# Patient Record
Sex: Male | Born: 1955 | Race: White | Hispanic: No | Marital: Single | Smoking: Never smoker
Health system: Southern US, Community
[De-identification: ages and names within clinical notes are randomized; demographics above are authoritative.]

## PROBLEM LIST (undated history)

## (undated) DIAGNOSIS — I10 Essential (primary) hypertension: Secondary | ICD-10-CM

## (undated) DIAGNOSIS — R519 Headache, unspecified: Secondary | ICD-10-CM

## (undated) DIAGNOSIS — M069 Rheumatoid arthritis, unspecified: Secondary | ICD-10-CM

## (undated) DIAGNOSIS — R51 Headache: Secondary | ICD-10-CM

## (undated) DIAGNOSIS — K219 Gastro-esophageal reflux disease without esophagitis: Secondary | ICD-10-CM

## (undated) HISTORY — PX: CARPAL TUNNEL RELEASE: SHX101

## (undated) HISTORY — PX: KNEE SURGERY: SHX244

## (undated) HISTORY — PX: ANKLE SURGERY: SHX546

## (undated) HISTORY — DX: Rheumatoid arthritis, unspecified: M06.9

## (undated) HISTORY — DX: Gastro-esophageal reflux disease without esophagitis: K21.9

## (undated) HISTORY — PX: CYST EXCISION: SHX5701

---

## 2000-01-01 ENCOUNTER — Encounter: Admission: RE | Admit: 2000-01-01 | Discharge: 2000-03-31 | Payer: Self-pay | Admitting: Neurology

## 2001-03-06 ENCOUNTER — Ambulatory Visit (HOSPITAL_BASED_OUTPATIENT_CLINIC_OR_DEPARTMENT_OTHER): Admission: RE | Admit: 2001-03-06 | Discharge: 2001-03-06 | Payer: Self-pay | Admitting: *Deleted

## 2001-09-18 ENCOUNTER — Encounter (HOSPITAL_COMMUNITY): Admission: RE | Admit: 2001-09-18 | Discharge: 2001-10-18 | Payer: Self-pay | Admitting: Preventative Medicine

## 2004-11-27 ENCOUNTER — Ambulatory Visit (HOSPITAL_COMMUNITY): Admission: RE | Admit: 2004-11-27 | Discharge: 2004-11-27 | Payer: Self-pay | Admitting: Family Medicine

## 2005-06-03 ENCOUNTER — Ambulatory Visit (HOSPITAL_COMMUNITY): Admission: RE | Admit: 2005-06-03 | Discharge: 2005-06-03 | Payer: Self-pay | Admitting: Family Medicine

## 2006-01-10 ENCOUNTER — Ambulatory Visit (HOSPITAL_COMMUNITY): Admission: RE | Admit: 2006-01-10 | Discharge: 2006-01-10 | Payer: Self-pay | Admitting: Urology

## 2006-01-20 ENCOUNTER — Ambulatory Visit (HOSPITAL_COMMUNITY): Admission: RE | Admit: 2006-01-20 | Discharge: 2006-01-20 | Payer: Self-pay | Admitting: Family Medicine

## 2006-01-26 ENCOUNTER — Emergency Department (HOSPITAL_COMMUNITY): Admission: EM | Admit: 2006-01-26 | Discharge: 2006-01-26 | Payer: Self-pay | Admitting: Emergency Medicine

## 2006-02-04 ENCOUNTER — Encounter (HOSPITAL_COMMUNITY): Admission: RE | Admit: 2006-02-04 | Discharge: 2006-03-06 | Payer: Self-pay | Admitting: Preventative Medicine

## 2006-12-23 HISTORY — PX: CHOLECYSTECTOMY: SHX55

## 2007-02-06 ENCOUNTER — Encounter (INDEPENDENT_AMBULATORY_CARE_PROVIDER_SITE_OTHER): Payer: Self-pay | Admitting: *Deleted

## 2007-02-06 ENCOUNTER — Ambulatory Visit (HOSPITAL_COMMUNITY): Admission: RE | Admit: 2007-02-06 | Discharge: 2007-02-06 | Payer: Self-pay | Admitting: General Surgery

## 2007-10-23 ENCOUNTER — Ambulatory Visit (HOSPITAL_COMMUNITY): Admission: RE | Admit: 2007-10-23 | Discharge: 2007-10-23 | Payer: Self-pay | Admitting: Family Medicine

## 2007-11-13 ENCOUNTER — Ambulatory Visit (HOSPITAL_COMMUNITY): Admission: RE | Admit: 2007-11-13 | Discharge: 2007-11-13 | Payer: Self-pay | Admitting: Family Medicine

## 2007-12-08 ENCOUNTER — Ambulatory Visit (HOSPITAL_COMMUNITY): Admission: RE | Admit: 2007-12-08 | Discharge: 2007-12-08 | Payer: Self-pay | Admitting: General Surgery

## 2009-02-03 ENCOUNTER — Ambulatory Visit (HOSPITAL_COMMUNITY): Admission: RE | Admit: 2009-02-03 | Discharge: 2009-02-03 | Payer: Self-pay | Admitting: Family Medicine

## 2009-02-10 ENCOUNTER — Ambulatory Visit (HOSPITAL_COMMUNITY): Admission: RE | Admit: 2009-02-10 | Discharge: 2009-02-10 | Payer: Self-pay | Admitting: Family Medicine

## 2009-12-14 ENCOUNTER — Ambulatory Visit (HOSPITAL_COMMUNITY): Admission: RE | Admit: 2009-12-14 | Discharge: 2009-12-14 | Payer: Self-pay | Admitting: Internal Medicine

## 2010-09-18 ENCOUNTER — Encounter (HOSPITAL_COMMUNITY): Admission: RE | Admit: 2010-09-18 | Discharge: 2010-09-21 | Payer: Self-pay | Admitting: Psychiatry

## 2010-10-03 ENCOUNTER — Encounter (HOSPITAL_COMMUNITY)
Admission: RE | Admit: 2010-10-03 | Discharge: 2010-11-02 | Payer: Self-pay | Source: Home / Self Care | Admitting: Psychiatry

## 2011-01-13 ENCOUNTER — Encounter: Payer: Self-pay | Admitting: Urology

## 2011-01-13 ENCOUNTER — Encounter: Payer: Self-pay | Admitting: Internal Medicine

## 2011-05-07 NOTE — H&P (Signed)
NAME:  Gary Cole, Gary Cole NO.:  [id number]   MEDICAL RECORD NO.:  [id number]          PATIENT TYPE:  AMB   LOCATION:  DAY                           FACILITY:  [facility]   PHYSICIAN:  Dalia Heading, M.D.  DATE OF BIRTH:  [birth year]   DATE OF ADMISSION:  DATE OF DISCHARGE:  LH                              HISTORY & PHYSICAL   PRIORITY PREADMISSION HISTORY AND PHYSICAL   CHIEF COMPLAINT:  Need for screening colonoscopy.   HISTORY OF PRESENT ILLNESS:  The patient is a 55 year old, white male,  who is referred for endoscopic evaluation.  He needs a colonoscopy for  screening purposes.  No abdominal pain, weight loss, nausea, vomiting,  diarrhea, constipation, melena, or hematochezia have been noted.  He has  never had a colonoscopy.  There is no family history of colon carcinoma.   PAST MEDICAL HISTORY:  Migraine headaches.   PAST SURGICAL HISTORY:  1. Laparoscopic cholecystectomy.  2. Left foot surgery.  3. Left hand surgery.  4. Neck surgery.   CURRENT MEDICATIONS:  Imitrex.   ALLERGIES:  No known drug allergies.   REVIEW OF SYSTEMS:  Noncontributory.   PHYSICAL EXAMINATION:  GENERAL:  The patient is a well-developed, well-  nourished, white male in no acute distress.  LUNGS:  Clear to auscultation with equal breath sounds bilaterally.  HEART:  A regular rate and rhythm without S3, S4, or murmurs.  ABDOMEN:  Soft, nontender, and nondistended, no hepatosplenomegaly or  masses are noted.  RECTAL EXAMINATION:  Deferred to the procedure.   IMPRESSION:  Need for screening colonoscopy.   PLAN:  The patient is scheduled for a colonoscopy on December 04, 2007.  The risks and benefits of the procedure including bleeding and  perforation were fully explained to the patient, who gave informed  consent.      Dalia Heading, M.D.  Electronically Signed     MAJ/MEDQ  D:  11/28/2007  T:  11/28/2007  Job:  [id number]   cc:   Short Stay at [facility]   Corrie Mckusick, M.D.  Fax: [phone]

## 2011-05-10 NOTE — Op Note (Signed)
NAME:  Gary Cole, Gary Cole NO.:  [id number]   MEDICAL RECORD NO.:  [id number]          PATIENT TYPE:  AMB   LOCATION:  DAY                           FACILITY:  [facility]   PHYSICIAN:  Dalia Heading, M.D.  DATE OF BIRTH:  [birth year]   DATE OF PROCEDURE:  02/06/2007  DATE OF DISCHARGE:                               OPERATIVE REPORT   PREOPERATIVE DIAGNOSIS:  Cholecystitis, cholelithiasis.   POSTOPERATIVE DIAGNOSIS:  Cholecystitis, cholelithiasis.   PROCEDURE:  Laparoscopic cholecystectomy.   SURGEON:  Dalia Heading, M.D.   ANESTHESIA:  General endotracheal.   INDICATIONS:  The patient is a 55 year old white male who presents with  cholecystitis secondary to cholelithiasis.  Risks and benefits of the  procedure including bleeding, infection, hepatobiliary injury, and the  possibly of an open procedure were fully explained to the patient, who  gave informed consent.   PROCEDURE NOTE:  The patient is placed in the supine position.  After  induction of general endotracheal anesthesia, the abdomen was prepped  and draped using the usual sterile technique with Betadine.  Surgical  site confirmation was performed.   An infraumbilical incision was made down to the fascia.  Veress needle  was introduced into the abdominal cavity and confirmation of placement  was done using the saline drop test.  The abdomen was then insufflated  to 16 mmHg pressure.  An 11 mm trocar was introduced into the abdominal  cavity under direct visualization without difficulty.  The patient was  placed in reversed Trendelenburg position.  An additional 11-mm trocar  was in the placed epigastric region and 5-mm trocars were placed in the  right upper quadrant and right flank regions.  The liver was inspected  and noted to within normal limits.  Gallbladder was retracted superior  and laterally.  The dissection was begun around the infundibulum of the  gallbladder.  The cystic duct was first  identified.  Its juncture to the  infundibulum fully identified.  Endoclips placed proximally and distally  on cystic duct and cystic duct was divided.  This was likewise done to  the cystic artery.  The gallbladder then freed away from the gallbladder  fossa using Bovie electrocautery.  The gallbladder delivered through the  epigastric trocar site using the EndoCatch bag.  The gallbladder fossa  was inspected.  No abnormal bleeding or bile leakage was noted.  Surgicel was placed in the gallbladder fossa.  All fluid and air was  then evacuated from the abdominal cavity prior to removing the trocars.   All wounds were irrigated with normal saline.  All wounds were injected  with  0.5% Sensorcaine.  The infraumbilical fascia was reapproximated  using an 0 Vicryl interrupted suture.  All skin incisions were closed  using staples.  Betadine ointment and dry sterile dressings were  applied.   All tape and needle counts correct at the end of the procedure.  The  patient was extubated in the operating room and went back to recovery  room awake in stable condition.  Complications none.   SPECIMEN:  Gallbladder with stones.   ESTIMATED  BLOOD LOSS:  Minimal.  my office to the chart to Dr. Jonny Ruiz holding thank you      Dalia Heading, M.D.  Electronically Signed     MAJ/MEDQ  D:  02/06/2007  T:  02/06/2007  Job:  [id number]   cc:   Corrie Mckusick, M.D.  Fax: [phone]

## 2011-05-10 NOTE — H&P (Signed)
NAME:  Gary Cole, Gary Cole NO.:  [id number]   MEDICAL RECORD NO.:  [id number]          PATIENT TYPE:  AMB   LOCATION:  DAY                           FACILITY:  [facility]   PHYSICIAN:  Dalia Heading, M.D.  DATE OF BIRTH:  14-Nov-1956   DATE OF ADMISSION:  DATE OF DISCHARGE:  LH                              HISTORY & PHYSICAL   CHIEF COMPLAINT:  Cholecystitis, cholelithiasis.   HISTORY OF PRESENT ILLNESS:  The patient is a 55 year old white male who  is referred for evaluation and treatment of biliary colic secondary to  cholelithiasis.  He has been having right upper quadrant abdominal pain,  nausea, bloating for many weeks. He does have fatty food intolerance.  No fever, chills or jaundice have been noted.   PAST MEDICAL HISTORY:  Includes migraines.   PAST SURGICAL HISTORY:  Left foot surgery, left hand surgery, neck  surgery.   CURRENT MEDICATIONS:  Imitrex p.r.n.   ALLERGIES:  NO KNOWN DRUG ALLERGIES.   REVIEW OF SYSTEMS:  Noncontributory.   SOCIAL HISTORY:  The patient drinks alcohol socially.   PHYSICAL EXAMINATION:  GENERAL APPEARANCE:  The patient is a well-  developed, well-nourished white male in no acute distress.  HEENT:  Examination reveals no scleral icterus.  LUNGS:  Clear to auscultation with equal breath sounds bilaterally.  HEART:  Examination reveals regular rate and rhythm without S3, S4, or  murmurs.  ABDOMEN:  The abdomen is soft and nondistended.  He is tender in the  right upper quadrant to palpation.  No hepatosplenomegaly, masses or  hernias are identified.  Ultrasound  of the gallbladder reveals  cholelithiasis with a normal common bile duct.   IMPRESSION:  Cholecystitis, cholelithiasis.   PLAN:  The patient is scheduled for laparoscopic cholecystectomy on  February 09, 2007.  The risks and benefits of the procedure including  bleeding, infection, hepatobiliary injury and the possibility an open  procedure were fully explained to  the patient, who gave informed  consent.      Dalia Heading, M.D.  Electronically Signed     MAJ/MEDQ  D:  01/22/2007  T:  01/22/2007  Job:  [id number]   cc:   [facility]  Fax: [phone]   Corrie Mckusick, M.D.  Fax: [phone]

## 2011-05-10 NOTE — Op Note (Signed)
[place]  Patient:    Gary Cole, Gary Cole                    MRN: [id number]. Date: 03/06/01 Attending:  Lowell Bouton, M.D.                           Operative Report  PREOPERATIVE DIAGNOSIS:  Left carpal tunnel syndrome.  POSTOPERATIVE DIAGNOSIS:  Left carpal tunnel syndrome.  PROCEDURE:  Decompression, median nerve, left carpal tunnel.  SURGEON:  Lowell Bouton, M.D.  ANESTHESIA: 0.5% Marcaine local with sedation.  OPERATIVE FINDINGS:  The patient had moderate compression of the nerve beneath the ligament.  The motor branch was intact, and there were no masses present in the carpal canal.  DESCRIPTION OF PROCEDURE:  Under 0.5% Marcaine local anesthesia, the left hand was prepped and draped in the usual fashion and after exsanguinating the limb, the tourniquet was inflated to 250 mmHg.  A 3 cm longitudinal incision was made in the palm just ulnar to the thenar crease.  Sharp dissection carried through the subcutaneous tissues, and bleeding points were coagulated.  Blunt dissection was carried through the superficial palmar fascia distal to the transverse carpal ligament, and a hemostat was placed in the carpal canal up against the hook of the hamate.  The transverse carpal ligament was then divided with a knife on the ulnar side of the median nerve.  The proximal end of the ligament was divided with the scissors after dissecting the nerve away from the undersurface of the ligament.  The carpal canal was then palpated and was found to be adequately decompressed.  The wound was irrigated copiously with saline.  The skin was closed with 4-0 nylon suture.  Sterile dressings were applied, followed by a volar wrist splint.  The patient tolerated the procedure well and went to the recovery room awake and stable, in good condition. DD:  03/06/01 TD:  03/07/01 Job: [id number]

## 2014-08-23 ENCOUNTER — Ambulatory Visit (INDEPENDENT_AMBULATORY_CARE_PROVIDER_SITE_OTHER): Payer: PRIVATE HEALTH INSURANCE

## 2014-08-23 ENCOUNTER — Ambulatory Visit (INDEPENDENT_AMBULATORY_CARE_PROVIDER_SITE_OTHER): Payer: PRIVATE HEALTH INSURANCE | Admitting: Orthopedic Surgery

## 2014-08-23 ENCOUNTER — Encounter: Payer: Self-pay | Admitting: Orthopedic Surgery

## 2014-08-23 VITALS — BP 138/79 | Ht 71.0 in | Wt 173.4 lb

## 2014-08-23 DIAGNOSIS — M25561 Pain in right knee: Secondary | ICD-10-CM

## 2014-08-23 DIAGNOSIS — M25569 Pain in unspecified knee: Secondary | ICD-10-CM

## 2014-08-23 NOTE — Progress Notes (Signed)
Subjective:     Patient ID: Gary Cole, male   DOB: 09-Sep-1956, 58 y.o.   MRN: [id number]  HPI Chief Complaint  Patient presents with  . Knee Pain    right knee pain 2-3 months, no known injury    Referral by Dr. Phillips Odor  58 year old male who presents with a 2 to three-month history of pain and stiffness and giving way symptoms of his right knee with posterior lateral knee pain which is best described by him as sharp throbbing stabbing aching pain which radiates into the back of his knee. The pain has become constant wakes him up at night worse after activity and he goes up and down inclines plain. He did take Aleve for 2-3 months without relief he put some ice on the knee as well he tried to stay off of it and modify his activities no other treatments were tried at this point.  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  His review of systems shows positive findings under difficulty sleeping sinus problems joint pain leg difficulties as stated headaches and wears glasses all other systems were reviewed and were negative  Review of Systems     Objective:   Physical Exam     BP 138/79  Ht 5\' 11"  (1.803 m)  Wt 173 lb 6.4 oz (78.654 kg)  BMI 24.20 kg/m2 General appearance is normal he has an excellent body weight and BMI is oriented x3 his mood and affect are normal he ambulates without assistive devices  He has normal upper extremities with normal alignment, no joint restriction or contracture or deformity, no instability or subluxation. Normal muscle tone normal skin  pulse temperature, no edema. Normal lymph nodes. Normal sensation. Normal reflexes and normal coordination of limbs  His left knee reveals normal alignment without swelling no tenderness full range of motion no instability normal strength normal muscle tone normal skin pulse temperature no edema swelling varicose veins lymphadenopathy. Normal sensation normal reflexes normal coordination of the limb  Right leg and knee posteromedial joint line tenderness positive McMurray sign no effusion. Full range of motion pain at terminal flexion no instability normal muscle tone and strength normal skin pulse temperature no edema no swelling or varicose veins normal lymph nodes normal sensation normal reflexes normal balance and coordination  Assessment:      X-ray was taken in the office it was normal  Impression torn medial meniscus  Recommend MRI to confirm the diagnosis and planned surgical intervention which is arthroscopic partial medial meniscectomy.     Plan:     MRI KNEE RIGHT

## 2014-08-23 NOTE — Patient Instructions (Signed)
We will schedule MRI for you 

## 2014-09-08 ENCOUNTER — Ambulatory Visit (HOSPITAL_COMMUNITY)
Admission: RE | Admit: 2014-09-08 | Discharge: 2014-09-08 | Disposition: A | Discharge status: Home / Self Care | Payer: PRIVATE HEALTH INSURANCE | Source: Ambulatory Visit | Attending: Orthopedic Surgery | Admitting: Orthopedic Surgery

## 2014-09-08 DIAGNOSIS — R937 Abnormal findings on diagnostic imaging of other parts of musculoskeletal system: Secondary | ICD-10-CM | POA: Diagnosis not present

## 2014-09-08 DIAGNOSIS — M25569 Pain in unspecified knee: Secondary | ICD-10-CM | POA: Diagnosis present

## 2014-09-08 DIAGNOSIS — M659 Unspecified synovitis and tenosynovitis, unspecified site: Secondary | ICD-10-CM | POA: Insufficient documentation

## 2014-09-08 DIAGNOSIS — M7120 Synovial cyst of popliteal space [Baker], unspecified knee: Secondary | ICD-10-CM | POA: Diagnosis not present

## 2014-09-08 DIAGNOSIS — M25561 Pain in right knee: Secondary | ICD-10-CM

## 2014-09-13 ENCOUNTER — Encounter: Payer: Self-pay | Admitting: Orthopedic Surgery

## 2014-09-13 ENCOUNTER — Ambulatory Visit (INDEPENDENT_AMBULATORY_CARE_PROVIDER_SITE_OTHER): Payer: PRIVATE HEALTH INSURANCE | Admitting: Orthopedic Surgery

## 2014-09-13 VITALS — BP 134/88 | Ht 71.0 in | Wt 173.4 lb

## 2014-09-13 DIAGNOSIS — IMO0002 Reserved for concepts with insufficient information to code with codable children: Secondary | ICD-10-CM

## 2014-09-13 DIAGNOSIS — M5410 Radiculopathy, site unspecified: Secondary | ICD-10-CM

## 2014-09-13 DIAGNOSIS — M25569 Pain in unspecified knee: Secondary | ICD-10-CM

## 2014-09-13 DIAGNOSIS — M25561 Pain in right knee: Secondary | ICD-10-CM

## 2014-09-13 MED ORDER — PREDNISONE (PAK) 10 MG PO TABS: ORAL_TABLET | ORAL | Status: DC

## 2014-09-13 MED ORDER — GABAPENTIN 100 MG PO CAPS: 100.0000 mg | ORAL_CAPSULE | Freq: Three times a day (TID) | ORAL | Status: DC

## 2014-09-13 NOTE — Progress Notes (Signed)
Followup visit including MRI evaluation the MRI shows that there is a possible tear of his lateral meniscus  On reexamination and repeat questioning the patient's pain runs from the middle of his thigh proximally to the middle of the shin distally with posterior lateral pain and primarily pain in the calf. He does have a history of lumbar disc disease in his MRI in 2000 and showed quite extensive disease although most of the herniation and bulging was on the left  His giving way symptoms could very well be related to lumbar disc disease he is lateral joint line is nontender he does not have a McMurray sign today and he has no effusion  At this point I cannot reliably give him a good estimate that his knee surgery would help  I recommend a Dosepak and gabapentin and see if he gets any better  His review of symptoms revealed no bowel or bladder dysfunction at this time  Meds ordered this encounter  Medications  . gabapentin (NEURONTIN) 100 MG capsule    Sig: Take 1 capsule (100 mg total) by mouth 3 (three) times daily.    Dispense:  90 capsule    Refill:  2  . predniSONE (STERAPRED UNI-PAK) 10 MG tablet    Sig: Use as directed    Dispense:  48 tablet    Refill:  0

## 2014-10-06 ENCOUNTER — Encounter: Payer: Self-pay | Admitting: Orthopedic Surgery

## 2014-10-06 ENCOUNTER — Ambulatory Visit (INDEPENDENT_AMBULATORY_CARE_PROVIDER_SITE_OTHER): Payer: PRIVATE HEALTH INSURANCE | Admitting: Orthopedic Surgery

## 2014-10-06 VITALS — BP 122/78 | Ht 71.0 in | Wt 173.4 lb

## 2014-10-06 DIAGNOSIS — M25561 Pain in right knee: Secondary | ICD-10-CM

## 2014-10-06 DIAGNOSIS — M5410 Radiculopathy, site unspecified: Secondary | ICD-10-CM

## 2014-10-06 NOTE — Progress Notes (Signed)
Chief Complaint  Patient presents with  . Follow-up    recheck Right knee s/p medication    The patient has done well with steroids and gabapentin  He had an MRI which showed a possible lateral meniscal tear  Is his leg feels much better. He still has some difficulty with steps and with squatting which I suspect is placed on his age and the minor arthritis we saw in his knee.  I don't think is meniscal tear symptomatic I think he has some neurologic pain in his right leg   recommend continue gabapentin 100 mg 3 times a day followup 2 months

## 2014-10-06 NOTE — Patient Instructions (Signed)
Continue gabapentin Refill at pharmacy

## 2014-12-06 ENCOUNTER — Ambulatory Visit: Payer: PRIVATE HEALTH INSURANCE | Admitting: Orthopedic Surgery

## 2015-01-25 ENCOUNTER — Encounter: Payer: Self-pay | Admitting: Orthopedic Surgery

## 2015-09-21 ENCOUNTER — Ambulatory Visit (HOSPITAL_COMMUNITY): Payer: PRIVATE HEALTH INSURANCE | Attending: Orthopedic Surgery

## 2015-09-21 ENCOUNTER — Encounter (HOSPITAL_COMMUNITY): Payer: Self-pay

## 2015-09-21 DIAGNOSIS — M7502 Adhesive capsulitis of left shoulder: Secondary | ICD-10-CM | POA: Insufficient documentation

## 2015-09-21 DIAGNOSIS — M25612 Stiffness of left shoulder, not elsewhere classified: Secondary | ICD-10-CM | POA: Diagnosis present

## 2015-09-21 DIAGNOSIS — M25512 Pain in left shoulder: Secondary | ICD-10-CM | POA: Diagnosis present

## 2015-09-21 DIAGNOSIS — R29898 Other symptoms and signs involving the musculoskeletal system: Secondary | ICD-10-CM | POA: Diagnosis present

## 2015-09-21 DIAGNOSIS — M7552 Bursitis of left shoulder: Secondary | ICD-10-CM | POA: Insufficient documentation

## 2015-09-21 DIAGNOSIS — M629 Disorder of muscle, unspecified: Secondary | ICD-10-CM | POA: Diagnosis present

## 2015-09-21 DIAGNOSIS — M6289 Other specified disorders of muscle: Secondary | ICD-10-CM

## 2015-09-21 NOTE — Therapy (Signed)
[facility] [address] [place], [place], [address] Phone: [phone]   Fax:  [phone]  Occupational Therapy Evaluation  Patient Details  Name: Gary Cole MRN: [id number] Date of Birth: 1956-08-14 Referring Provider:  Sheral Apley, MD  Encounter Date: 09/21/2015      OT End of Session - 09/21/15 1705    Visit Number 1   Number of Visits 8   Date for OT Re-Evaluation 11/20/15  Mini reassess: 10/19/15   Authorization Type medical mutual PPO ([place]) No visit limit   OT Start Time 1531   OT Stop Time 1610   OT Time Calculation (min) 39 min   Activity Tolerance Patient tolerated treatment well   Behavior During Therapy [facility] for tasks assessed/performed      History reviewed. No pertinent past medical history.  No past surgical history on file.  There were no vitals filed for this visit.  Visit Diagnosis:  Bursitis of shoulder region, left - Plan: Ot plan of care cert/re-cert  Adhesive capsulitis of shoulder, left - Plan: Ot plan of care cert/re-cert  Pain in joint, shoulder region, left - Plan: Ot plan of care cert/re-cert  Tight fascia - Plan: Ot plan of care cert/re-cert  Shoulder weakness - Plan: Ot plan of care cert/re-cert  Shoulder stiffness, left - Plan: Ot plan of care cert/re-cert      Subjective Assessment - 09/21/15 1659    Subjective  S: I used to not be able to lift this arm at all.    Pertinent History Patient is a 59 y/o male S/P left shoulder bursitis and adhesive capsulitis which began approximately August 2016 when patient was using weedeater. Pt states that an X-Ray and MRI have been completed and there are no tears noted. Pt states that he has bursitis and something going on with his bicep tendon. Dr. Eulah Pont has referred patient to occupational therapy for evaluation and treatment.    Special Tests FOTO score: 46/100 (54% impaired)   Patient Stated Goals To be able to use arm normally.    Currently in Pain? --  only with movement 7-10/10 pain           [facility] OT Assessment - 09/21/15 1524    Assessment   Diagnosis Left shoulder bursitis and adhesive capsulitis   Onset Date --  August 2016   Assessment November - Dr. Eulah Pont   Prior Therapy None   Precautions   Precautions None   Restrictions   Weight Bearing Restrictions No   Balance Screen   Has the patient fallen in the past 6 months No   Home  Environment   Family/patient expects to be discharged to: Private residence   Prior Function   Level of Independence Independent   Vocation Full time employment   Vocation Requirements [employer] - Lifting   ADL   ADL comments Difficulty reaching overhead using left arm at home and work.    Mobility   Mobility Status Independent   Written Expression   Dominant Hand Right   Vision - History   Baseline Vision Wears glasses all the time   Cognition   Overall Cognitive Status Within Functional Limits for tasks assessed   ROM / Strength   AROM / PROM / Strength AROM;Strength;PROM   Palpation   Palpation comment Mod fascial restictions in left upper arm, trapezius, and scapularis region.    AROM   Overall AROM Comments Assessed seated. ir/ER adducted.   AROM Assessment Site  Shoulder   Right/Left Shoulder Left   Left Shoulder Flexion 91 Degrees   Left Shoulder ABduction 68 Degrees   Left Shoulder Internal Rotation 90 Degrees   Left Shoulder External Rotation 25 Degrees  painful   PROM   Overall PROM  Within functional limits for tasks performed   Overall PROM Comments Assessed supine. ir/ED adducted   PROM Assessment Site Shoulder   Right/Left Shoulder Left   Strength   Overall Strength Unable to assess;Due to pain   Strength Assessment Site Shoulder   Right/Left Shoulder Left                         OT Education - 09/21/15 1559    Education provided Yes   Education Details diagnosis and shoulder anatomy. table slides and shoulder  stretches   Person(s) Educated Patient   Methods Explanation;Demonstration;Handout   Comprehension Returned demonstration;Verbalized understanding          OT Short Term Goals - 09/21/15 1709    OT SHORT TERM GOAL #1   Title Patient will be educated and independent with HEP.    Time 4   Period Weeks   Status New   OT SHORT TERM GOAL #2   Title Patient will return to highest level of independence with all daily and work related tasks.   Time 4   Period Weeks   Status New   OT SHORT TERM GOAL #3   Title Patient will increase A/ROM to [facility] to increase ability to complete tasks above shoulder level.   Time 4   Period Weeks   Status New   OT SHORT TERM GOAL #4   Title Patient will increase left shoulder strength to 4-/5 to increase ability to complete heavy lifting or pulling tasks at work.    Time 4   Period Weeks   Status New   OT SHORT TERM GOAL #5   Title Patient will decrease pain level during completing daily and work tasks to 4/10 or less.    Time 4   Period Weeks   Status New   Additional Short Term Goals   Additional Short Term Goals Yes   OT SHORT TERM GOAL #6   Title Patient will decrease fascial restrictions to min amount.    Time 4   Period Weeks   Status New                  Plan - 09/21/15 1707    Clinical Impression Statement A: Patient is a 59 y/o male S/P left shoulder bursitis and adhesive capsulitis causing increased pain and fascial restrictions and decreased strength and ROM resulting in difficulty completing daily and work related tasks using LUE.    Pt will benefit from skilled therapeutic intervention in order to improve on the following deficits (Retired) Decreased strength;Pain;Impaired UE functional use;Increased fascial restricitons;Decreased range of motion   Rehab Potential Excellent   OT Frequency 2x / week   OT Duration 4 weeks   OT Treatment/Interventions Self-care/ADL training;Ultrasound;Passive range of motion;Patient/family  education;Cryotherapy;Electrical Stimulation;Moist Heat;Therapeutic activities;Therapeutic exercises;Manual Therapy   Plan P: Patient will benefit from skilled OT services to increase functional performance during daily tasks using LUE as non dominant extremity. Treatment Plan: Myofascial release, manual stretching, muscle energy technique, AA/ROM, A/ROM, general strengthening, scapular stability and strengthening.    Consulted and Agree with Plan of Care Patient        Problem List There are no active problems to display  for this patient.   [name], OTR/L,CBIS  [phone]  09/21/2015, 5:21 PM  [facility] [address] [place], [place], [address] Phone: [phone]   Fax:  [phone]

## 2015-09-21 NOTE — Patient Instructions (Signed)
*  Complete 2-3x times every day.   Flexibility: Corner Stretch   Standing in corner with hands just above shoulder level and 2 feet  from corner, lean forward until a comfortable stretch is felt across chest. Hold _10___ seconds. Repeat __3__ times per set.   Scapular Retraction (Standing)   With arms at sides, pinch shoulder blades together. Repeat _10___ times per set. Do ____ sets per session. Do ____ sessions per day.    Posterior Capsule Stretch   Stand or sit, one arm across body so hand rests over opposite shoulder. Gently push on crossed elbow with other hand until stretch is felt in shoulder of crossed arm. Hold __10_ seconds.  Repeat _3__ times per session.    SHOULDER: Flexion On Table   Place hands on table, elbows straight. Move hips away from body. Press hands down into table. Hold ___ seconds. ___ reps per set, ___ sets per day, ___ days per week  Abduction (Passive)   With arm out to side, resting on table, lower head toward arm, keeping trunk away from table. Hold ____ seconds. Repeat ____ times. Do ____ sessions per day.  Copyright  VHI. All rights reserved.     Internal Rotation (Assistive)   Seated with elbow bent at right angle and held against side, slide arm on table surface in an inward arc. Repeat ____ times. Do ____ sessions per day. Activity: Use this motion to brush crumbs off the table.  Copyright  V[employer]. All rights reserved.

## 2015-09-26 ENCOUNTER — Encounter (HOSPITAL_COMMUNITY): Payer: Self-pay | Admitting: Occupational Therapy

## 2015-09-26 ENCOUNTER — Ambulatory Visit (HOSPITAL_COMMUNITY): Payer: PRIVATE HEALTH INSURANCE | Attending: Orthopedic Surgery | Admitting: Occupational Therapy

## 2015-09-26 DIAGNOSIS — R29898 Other symptoms and signs involving the musculoskeletal system: Secondary | ICD-10-CM | POA: Insufficient documentation

## 2015-09-26 DIAGNOSIS — M6289 Other specified disorders of muscle: Secondary | ICD-10-CM

## 2015-09-26 DIAGNOSIS — M25512 Pain in left shoulder: Secondary | ICD-10-CM | POA: Diagnosis not present

## 2015-09-26 DIAGNOSIS — M25612 Stiffness of left shoulder, not elsewhere classified: Secondary | ICD-10-CM | POA: Diagnosis present

## 2015-09-26 DIAGNOSIS — M629 Disorder of muscle, unspecified: Secondary | ICD-10-CM | POA: Insufficient documentation

## 2015-09-26 NOTE — Therapy (Signed)
[facility] [address] [place], [place], [address] Phone: [phone]   Fax:  [phone]  Occupational Therapy Treatment  Patient Details  Name: Gary Cole MRN: [id number] Date of Birth: 12/19/1956 Referring Provider:  Assunta Found, MD  Encounter Date: 09/26/2015      OT End of Session - 09/26/15 1610    Visit Number 2   Number of Visits 8   Date for OT Re-Evaluation 11/20/15  Mini reassess: 10/19/15   Authorization Type medical mutual PPO ([place]) No visit limit   OT Start Time 1520   OT Stop Time 1602   OT Time Calculation (min) 42 min   Activity Tolerance Patient tolerated treatment well   Behavior During Therapy [facility] for tasks assessed/performed      History reviewed. No pertinent past medical history.  No past surgical history on file.  There were no vitals filed for this visit.  Visit Diagnosis:  Pain in joint, shoulder region, left  Tight fascia  Shoulder weakness  Shoulder stiffness, left      Subjective Assessment - 09/26/15 1521    Subjective  S: I'm feeling ok today, yesterday was a different story.    Currently in Pain? Yes   Pain Score 3    Pain Location Shoulder   Pain Orientation Left   Pain Descriptors / Indicators Aching;Constant   Pain Type Chronic pain            [facility] OT Assessment - 09/26/15 1610    Assessment   Diagnosis Left shoulder bursitis and adhesive capsulitis   Precautions   Precautions None                  OT Treatments/Exercises (OP) - 09/26/15 1523    Exercises   Exercises Shoulder   Shoulder Exercises: Supine   Protraction AAROM;10 reps   Horizontal ABduction AAROM;10 reps   External Rotation AAROM;10 reps   Internal Rotation AAROM;10 reps   Flexion AAROM;10 reps   ABduction AAROM;10 reps   Shoulder Exercises: Seated   Elevation AROM;10 reps   Extension AROM;10 reps   Row AROM;10 reps   Shoulder Exercises: Pulleys   Flexion 1 minute   ABduction 1 minute   Shoulder Exercises: Therapy Ball   Flexion 10 reps   ABduction 10 reps   Manual Therapy   Manual Therapy Myofascial release   Myofascial Release Myofascial release to left upper arm, trapezius, and scapularis regions.                   OT Short Term Goals - 09/26/15 1613    OT SHORT TERM GOAL #1   Title Patient will be educated and independent with HEP.    Time 4   Period Weeks   Status On-going   OT SHORT TERM GOAL #2   Title Patient will return to highest level of independence with all daily and work related tasks.   Time 4   Period Weeks   Status On-going   OT SHORT TERM GOAL #3   Title Patient will increase A/ROM to [facility] to increase ability to complete tasks above shoulder level.   Time 4   Period Weeks   Status On-going   OT SHORT TERM GOAL #4   Title Patient will increase left shoulder strength to 4-/5 to increase ability to complete heavy lifting or pulling tasks at work.    Time 4   Period Weeks   Status On-going   OT SHORT  TERM GOAL #5   Title Patient will decrease pain level during completing daily and work tasks to 4/10 or less.    Time 4   Period Weeks   Status On-going   OT SHORT TERM GOAL #6   Title Patient will decrease fascial restrictions to min amount.    Time 4   Period Weeks   Status On-going                  Plan - 09/26/15 1610    Clinical Impression Statement A: Initiated myofascial release, PROM, and AAROM exercises this session. Pt completed exercises with good form and minimal cuing.  Pt reports he is able to move his arm much more since beginning his HEP.    Plan P: Add proximal shoulder strengthening in supine, attempt AAROM seated if able to tolerate.         Problem List There are no active problems to display for this patient.   Ezra Sites, OTR/L  [phone]  09/26/2015, 4:14 PM  [place] [facility] [address] [place], [place], [address] Phone:  [phone]   Fax:  [phone]

## 2015-09-29 ENCOUNTER — Ambulatory Visit (HOSPITAL_COMMUNITY): Payer: PRIVATE HEALTH INSURANCE | Admitting: Occupational Therapy

## 2015-09-29 DIAGNOSIS — M629 Disorder of muscle, unspecified: Secondary | ICD-10-CM

## 2015-09-29 DIAGNOSIS — M25512 Pain in left shoulder: Secondary | ICD-10-CM | POA: Diagnosis not present

## 2015-09-29 DIAGNOSIS — M6289 Other specified disorders of muscle: Secondary | ICD-10-CM

## 2015-09-29 DIAGNOSIS — M25612 Stiffness of left shoulder, not elsewhere classified: Secondary | ICD-10-CM

## 2015-09-29 DIAGNOSIS — R29898 Other symptoms and signs involving the musculoskeletal system: Secondary | ICD-10-CM

## 2015-09-29 NOTE — Therapy (Signed)
[facility] [address] [place], [place], [address] Phone: [phone]   Fax:  [phone]  Occupational Therapy Treatment  Patient Details  Name: Gary Cole MRN: [id number] Date of Birth: 08/26/1956 Referring Provider:  Sheral Apley, MD  Encounter Date: 09/29/2015      OT End of Session - 09/29/15 1605    Visit Number 3   Number of Visits 8   Date for OT Re-Evaluation 11/20/15  Mini reassess: 10/19/15   Authorization Type medical mutual PPO ([place]) No visit limit   OT Start Time 1522   OT Stop Time 1600   OT Time Calculation (min) 38 min   Activity Tolerance Patient tolerated treatment well   Behavior During Therapy [facility] for tasks assessed/performed      No past medical history on file.  No past surgical history on file.  There were no vitals filed for this visit.  Visit Diagnosis:  Pain in joint, shoulder region, left  Tight fascia  Shoulder weakness  Shoulder stiffness, left      Subjective Assessment - 09/29/15 1523    Subjective  S: As long as I don't move it I'm good.    Currently in Pain? No/denies                      OT Treatments/Exercises (OP) - 09/29/15 1524    Exercises   Exercises Shoulder   Shoulder Exercises: Supine   Protraction PROM;5 reps;AAROM;10 reps   Horizontal ABduction PROM;5 reps;AAROM;10 reps   External Rotation PROM;5 reps;AAROM;10 reps   Internal Rotation PROM;5 reps;AAROM;10 reps   Flexion PROM;5 reps;AAROM;10 reps   ABduction PROM;5 reps;AAROM;10 reps   Shoulder Exercises: Standing   Protraction AAROM;10 reps   Horizontal ABduction AAROM;10 reps   External Rotation AAROM;10 reps   Internal Rotation AAROM;10 reps   Flexion AAROM;10 reps   ABduction AAROM;10 reps   Shoulder Exercises: Pulleys   Flexion 1 minute   ABduction 1 minute   Shoulder Exercises: ROM/Strengthening   Proximal Shoulder Strengthening, Supine 10X each no rest break   Manual Therapy    Manual Therapy Myofascial release   Myofascial Release Myofascial release to left upper arm, trapezius, and scapularis regions.                   OT Short Term Goals - 09/26/15 1613    OT SHORT TERM GOAL #1   Title Patient will be educated and independent with HEP.    Time 4   Period Weeks   Status On-going   OT SHORT TERM GOAL #2   Title Patient will return to highest level of independence with all daily and work related tasks.   Time 4   Period Weeks   Status On-going   OT SHORT TERM GOAL #3   Title Patient will increase A/ROM to [facility] to increase ability to complete tasks above shoulder level.   Time 4   Period Weeks   Status On-going   OT SHORT TERM GOAL #4   Title Patient will increase left shoulder strength to 4-/5 to increase ability to complete heavy lifting or pulling tasks at work.    Time 4   Period Weeks   Status On-going   OT SHORT TERM GOAL #5   Title Patient will decrease pain level during completing daily and work tasks to 4/10 or less.    Time 4   Period Weeks   Status On-going   OT  SHORT TERM GOAL #6   Title Patient will decrease fascial restrictions to min amount.    Time 4   Period Weeks   Status On-going                  Plan - 09/29/15 1605    Clinical Impression Statement A: Added proximal shoulder strengthening in supine, AAROM in sitting. Pt demonstrates good form with minimal initial verbal cuing. Pt reports minimal pain throughout session.    Plan P: Add AAROM HEP, add wall wash & thumb tacks.         Problem List There are no active problems to display for this patient.   Ezra Sites, OTR/L  [phone]  09/29/2015, 4:07 PM  [facility] [address] [place], [place], [address] Phone: [phone]   Fax:  [phone]

## 2015-10-03 ENCOUNTER — Encounter (HOSPITAL_COMMUNITY): Payer: PRIVATE HEALTH INSURANCE

## 2015-10-05 ENCOUNTER — Encounter (HOSPITAL_COMMUNITY): Payer: Self-pay

## 2015-10-05 ENCOUNTER — Ambulatory Visit (HOSPITAL_COMMUNITY): Payer: PRIVATE HEALTH INSURANCE

## 2015-10-05 DIAGNOSIS — M25512 Pain in left shoulder: Secondary | ICD-10-CM | POA: Diagnosis not present

## 2015-10-05 DIAGNOSIS — M25612 Stiffness of left shoulder, not elsewhere classified: Secondary | ICD-10-CM

## 2015-10-05 DIAGNOSIS — M629 Disorder of muscle, unspecified: Secondary | ICD-10-CM

## 2015-10-05 DIAGNOSIS — R29898 Other symptoms and signs involving the musculoskeletal system: Secondary | ICD-10-CM

## 2015-10-05 DIAGNOSIS — M6289 Other specified disorders of muscle: Secondary | ICD-10-CM

## 2015-10-05 NOTE — Therapy (Signed)
[facility] [address] [place], [place], [address] Phone: [phone]   Fax:  [phone]  Occupational Therapy Treatment  Patient Details  Name: Gary Cole MRN: [id number] Date of Birth: 18-Oct-1956 No Data Recorded  Encounter Date: 10/05/2015      OT End of Session - 10/05/15 1810    Visit Number 4   Number of Visits 8   Date for OT Re-Evaluation 11/20/15  Mini reassess: 10/19/15   Authorization Type medical mutual PPO ([place]) No visit limit   OT Start Time 1516   OT Stop Time 1600   OT Time Calculation (min) 44 min   Activity Tolerance Patient tolerated treatment well   Behavior During Therapy [facility] for tasks assessed/performed      History reviewed. No pertinent past medical history.  No past surgical history on file.  There were no vitals filed for this visit.  Visit Diagnosis:  Pain in joint, shoulder region, left  Tight fascia  Shoulder stiffness, left  Shoulder weakness      Subjective Assessment - 10/05/15 1808    Subjective  S: If I don't use it it feels fine.    Currently in Pain? Yes   Pain Score 5    Pain Location Shoulder   Pain Orientation Left   Pain Descriptors / Indicators Aching;Constant   Pain Type Chronic pain            [facility] OT Assessment - 10/05/15 1809    Assessment   Diagnosis Left shoulder bursitis and adhesive capsulitis   Precautions   Precautions None                  OT Treatments/Exercises (OP) - 10/05/15 1536    Exercises   Exercises Shoulder   Shoulder Exercises: Supine   Protraction PROM;5 reps;AAROM;10 reps   Horizontal ABduction PROM;5 reps;AAROM;10 reps   External Rotation PROM;5 reps;AAROM;10 reps   Internal Rotation PROM;5 reps;AAROM;10 reps   Flexion PROM;5 reps;AAROM;10 reps   ABduction PROM;5 reps;AAROM;10 reps   Shoulder Exercises: Standing   Protraction AAROM;10 reps   Horizontal ABduction AAROM;10 reps   External Rotation AAROM;10 reps    Internal Rotation AAROM;10 reps   Flexion AAROM;10 reps   ABduction AAROM;10 reps   Shoulder Exercises: ROM/Strengthening   Thumb Tacks 1'   Proximal Shoulder Strengthening, Supine 10X each no rest break   Proximal Shoulder Strengthening, Seated 10X no rest breaks   Manual Therapy   Manual Therapy Myofascial release;Muscle Energy Technique   Myofascial Release Myofascial release to left upper arm, trapezius, and scapularis regions.    Muscle Energy Technique Muscle energy technique to left anterior and medial deltoid to decrease muscle spasm and increase joint ROM.                  OT Short Term Goals - 09/26/15 1613    OT SHORT TERM GOAL #1   Title Patient will be educated and independent with HEP.    Time 4   Period Weeks   Status On-going   OT SHORT TERM GOAL #2   Title Patient will return to highest level of independence with all daily and work related tasks.   Time 4   Period Weeks   Status On-going   OT SHORT TERM GOAL #3   Title Patient will increase A/ROM to [facility] to increase ability to complete tasks above shoulder level.   Time 4   Period Weeks   Status On-going  OT SHORT TERM GOAL #4   Title Patient will increase left shoulder strength to 4-/5 to increase ability to complete heavy lifting or pulling tasks at work.    Time 4   Period Weeks   Status On-going   OT SHORT TERM GOAL #5   Title Patient will decrease pain level during completing daily and work tasks to 4/10 or less.    Time 4   Period Weeks   Status On-going   OT SHORT TERM GOAL #6   Title Patient will decrease fascial restrictions to min amount.    Time 4   Period Weeks   Status On-going                  Plan - 10/05/15 1810    Clinical Impression Statement A: Updated HEP to AA/ROM exercises. Added thumb tacks and wall wash exercise. Pt continues to have increased pain with passive ROM and limited joint mobility due to increased pain.    Plan P: Continue to work on increase  passive ROM within patient's tolerance level.         Problem List There are no active problems to display for this patient.   [name], OTR/L,CBIS  [phone]  10/05/2015, 6:12 PM  [facility] [address] [place], [place], [address] Phone: [phone]   Fax:  [phone]  Name: Gary Cole MRN: [id number] Date of Birth: 04-19-1956

## 2015-10-05 NOTE — Patient Instructions (Signed)
Perform each exercise _____10-12___ reps. 2-3x days.   Protraction - STANDING  Start by holding a wand or cane at chest height.  Next, slowly push the wand outwards in front of your body so that your elbows become fully straightened. Then, return to the original position.     Shoulder FLEXION - STANDING - PALMS DOWN  In the standing position, hold a wand/cane with both arms, palms down on both sides. Raise up the wand/cane allowing your unaffected arm to perform most of the effort. Your affected arm should be partially relaxed.      Internal/External ROTATION - STANDING  In the standing position, hold a wand/cane with both hands keeping your elbows bent. Move your arms and wand/cane to one side.  Your affected arm should be partially relaxed while your unaffected arm performs most of the effort.       Shoulder ABDUCTION - STANDING  While holding a wand/cane palm face up on the injured side and palm face down on the uninjured side, slowly raise up your injured arm to the side.       Horizontal Abduction/Adduction      Straight arms holding cane at shoulder height, bring cane to right, center, left. Repeat starting to left.   Copyright  VHI. All rights reserved.       

## 2015-10-10 ENCOUNTER — Ambulatory Visit (HOSPITAL_COMMUNITY): Payer: PRIVATE HEALTH INSURANCE | Admitting: Occupational Therapy

## 2015-10-10 ENCOUNTER — Encounter (HOSPITAL_COMMUNITY): Payer: Self-pay | Admitting: Occupational Therapy

## 2015-10-10 DIAGNOSIS — M6289 Other specified disorders of muscle: Secondary | ICD-10-CM

## 2015-10-10 DIAGNOSIS — M25512 Pain in left shoulder: Secondary | ICD-10-CM | POA: Diagnosis not present

## 2015-10-10 DIAGNOSIS — R29898 Other symptoms and signs involving the musculoskeletal system: Secondary | ICD-10-CM

## 2015-10-10 DIAGNOSIS — M629 Disorder of muscle, unspecified: Secondary | ICD-10-CM

## 2015-10-10 DIAGNOSIS — M25612 Stiffness of left shoulder, not elsewhere classified: Secondary | ICD-10-CM

## 2015-10-10 NOTE — Therapy (Signed)
[place], [place], [address] Phone: [phone]   Fax:  [phone]  Occupational Therapy Treatment  Patient Details  Name: Gary Cole MRN: [id number] Date of Birth: 1956/04/02 No Data Recorded  Encounter Date: 10/10/2015      OT End of Session - 10/10/15 1608    Visit Number 5   Number of Visits 8   Date for OT Re-Evaluation 11/20/15  Mini reassess: 10/19/15   Authorization Type medical mutual PPO ([place]) No visit limit   OT Start Time 1525   OT Stop Time 1604   OT Time Calculation (min) 39 min   Activity Tolerance Patient tolerated treatment well   Behavior During Therapy [facility] for tasks assessed/performed      History reviewed. No pertinent past medical history.  No past surgical history on file.  There were no vitals filed for this visit.  Visit Diagnosis:  Pain in joint, shoulder region, left  Tight fascia  Shoulder stiffness, left  Shoulder weakness      Subjective Assessment - 10/10/15 1527    Subjective  S: It's getting there, I can lift it more than I have been able to.    Currently in Pain? No/denies            [facility] OT Assessment - 10/10/15 1608    Assessment   Diagnosis Left shoulder bursitis and adhesive capsulitis   Precautions   Precautions None                  OT Treatments/Exercises (OP) - 10/10/15 1527    Exercises   Exercises Shoulder   Shoulder Exercises: Supine   Protraction PROM;5 reps;AAROM;12 reps   Horizontal ABduction PROM;5 reps;AAROM;12 reps   External Rotation PROM;5 reps;AAROM;12 reps   Internal Rotation PROM;5 reps;AAROM;12 reps   Flexion PROM;5 reps;AAROM;12 reps   ABduction PROM;5 reps;AAROM;12 reps   Shoulder Exercises: Standing   Protraction AAROM;10 reps   Horizontal ABduction AAROM;10 reps   External Rotation AAROM;10 reps   Internal Rotation AAROM;10 reps   Flexion AAROM;10 reps   ABduction AAROM;10 reps   Shoulder  Exercises: Pulleys   Flexion 2 minutes   ABduction 2 minutes   Shoulder Exercises: ROM/Strengthening   Wall Wash 1'   Thumb Tacks 1'   Proximal Shoulder Strengthening, Supine 10X each no rest break   Proximal Shoulder Strengthening, Seated 10X no rest breaks   Manual Therapy   Manual Therapy Myofascial release;Muscle Energy Technique   Myofascial Release Myofascial release to left upper arm, trapezius, and scapularis regions.    Muscle Energy Technique Muscle energy technique to left anterior and medial deltoid to decrease muscle spasm and increase joint ROM.                  OT Short Term Goals - 09/26/15 1613    OT SHORT TERM GOAL #1   Title Patient will be educated and independent with HEP.    Time 4   Period Weeks   Status On-going   OT SHORT TERM GOAL #2   Title Patient will return to highest level of independence with all daily and work related tasks.   Time 4   Period Weeks   Status On-going   OT SHORT TERM GOAL #3   Title Patient will increase A/ROM to [facility] to increase ability to complete tasks above shoulder level.   Time 4   Period Weeks   Status On-going   OT SHORT TERM  GOAL #4   Title Patient will increase left shoulder strength to 4-/5 to increase ability to complete heavy lifting or pulling tasks at work.    Time 4   Period Weeks   Status On-going   OT SHORT TERM GOAL #5   Title Patient will decrease pain level during completing daily and work tasks to 4/10 or less.    Time 4   Period Weeks   Status On-going   OT SHORT TERM GOAL #6   Title Patient will decrease fascial restrictions to min amount.    Time 4   Period Weeks   Status On-going                  Plan - 10/10/15 1608    Clinical Impression Statement A: Pt able to tolerate slight increase in P/ROM during manual stretching this session. Increased pulleys to 2 minutes, increase supine repetitions to 12. Pt required min verbal cuing for form during exercises.    Plan P: Continue  to work on increasing P/ROM to [facility], add prot/ret/elev/dep        Problem List There are no active problems to display for this patient.   Guadelupe Sabin, OTR/L  [phone]  10/10/2015, 4:10 PM  [facility] [address] [place], [place], [address] Phone: [phone]   Fax:  [phone]  Name: Gary Cole MRN: [id number] Date of Birth: 1956/06/01

## 2015-10-13 ENCOUNTER — Encounter (HOSPITAL_COMMUNITY): Payer: Self-pay

## 2015-10-13 ENCOUNTER — Ambulatory Visit (HOSPITAL_COMMUNITY): Payer: PRIVATE HEALTH INSURANCE

## 2015-10-13 DIAGNOSIS — M6289 Other specified disorders of muscle: Secondary | ICD-10-CM

## 2015-10-13 DIAGNOSIS — M25512 Pain in left shoulder: Secondary | ICD-10-CM

## 2015-10-13 DIAGNOSIS — M25612 Stiffness of left shoulder, not elsewhere classified: Secondary | ICD-10-CM

## 2015-10-13 DIAGNOSIS — M629 Disorder of muscle, unspecified: Secondary | ICD-10-CM

## 2015-10-13 DIAGNOSIS — R29898 Other symptoms and signs involving the musculoskeletal system: Secondary | ICD-10-CM

## 2015-10-13 NOTE — Therapy (Signed)
[facility] [place], [place], [address] Phone: [phone]   Fax:  [phone]  Occupational Therapy Treatment  Patient Details  Name: Gary Cole MRN: [id number] Date of Birth: 1956/08/07 Referring Provider: Percell Miller  Encounter Date: 10/13/2015      OT End of Session - 10/13/15 1707    Visit Number 6   Number of Visits 8   Date for OT Re-Evaluation 11/20/15  Mini reassess: 10/19/15   Authorization Type medical mutual PPO ([place]) No visit limit   OT Start Time 1520   OT Stop Time 1603   OT Time Calculation (min) 43 min   Activity Tolerance Patient tolerated treatment well   Behavior During Therapy [facility] for tasks assessed/performed      History reviewed. No pertinent past medical history.  No past surgical history on file.  There were no vitals filed for this visit.  Visit Diagnosis:  Tight fascia  Pain in joint, shoulder region, left  Shoulder stiffness, left  Shoulder weakness      Subjective Assessment - 10/13/15 1543    Subjective  S: it feels good when before I get here but afterwards it's usually huritng.    Currently in Pain? Yes   Pain Score 4    Pain Location Shoulder   Pain Orientation Left   Pain Descriptors / Indicators Sore   Pain Type Chronic pain            [facility] OT Assessment - 10/13/15 1543    Assessment   Diagnosis Left shoulder bursitis and adhesive capsulitis   Referring Provider Percell Miller   Precautions   Precautions None                  OT Treatments/Exercises (OP) - 10/13/15 1544    Exercises   Exercises Shoulder   Shoulder Exercises: Supine   Protraction PROM;5 reps;AAROM;12 reps   Horizontal ABduction PROM;5 reps;AAROM;12 reps   External Rotation PROM;5 reps;AAROM;12 reps   Internal Rotation PROM;5 reps;AAROM;12 reps   Flexion PROM;5 reps;AAROM;12 reps   ABduction PROM;5 reps;AAROM;12 reps   Modalities   Modalities Electrical Stimulation;Moist Heat   Moist Heat Therapy   Number Minutes Moist Heat 10 Minutes   Moist Heat Location Shoulder   Electrical Stimulation   Electrical Stimulation Location Left shoulder/deltoid   Electrical Stimulation Action Interferential   Electrical Stimulation Parameters 9.2 CV   Electrical Stimulation Goals Pain   Manual Therapy   Manual Therapy Myofascial release;Muscle Energy Technique   Myofascial Release Myofascial release to left upper arm, trapezius, and scapularis regions.    Muscle Energy Technique Muscle energy technique to left anterior and medial deltoid to decrease muscle spasm and increase joint ROM.                  OT Short Term Goals - 09/26/15 1613    OT SHORT TERM GOAL #1   Title Patient will be educated and independent with HEP.    Time 4   Period Weeks   Status On-going   OT SHORT TERM GOAL #2   Title Patient will return to highest level of independence with all daily and work related tasks.   Time 4   Period Weeks   Status On-going   OT SHORT TERM GOAL #3   Title Patient will increase A/ROM to [facility] to increase ability to complete tasks above shoulder level.   Time 4   Period Weeks   Status On-going   OT SHORT  TERM GOAL #4   Title Patient will increase left shoulder strength to 4-/5 to increase ability to complete heavy lifting or pulling tasks at work.    Time 4   Period Weeks   Status On-going   OT SHORT TERM GOAL #5   Title Patient will decrease pain level during completing daily and work tasks to 4/10 or less.    Time 4   Period Weeks   Status On-going   OT SHORT TERM GOAL #6   Title Patient will decrease fascial restrictions to min amount.    Time 4   Period Weeks   Status On-going                  Plan - 10/13/15 1708    Clinical Impression Statement A: Patient with increased trigger points in left medial deltoid region. ES and moist heat completed at end of session for pain management.    Plan P: Add pro/ret/elev/dep and PVC slide  activitiy.         Problem List There are no active problems to display for this patient.   Ailene Ravel, OTR/L,CBIS  [phone]  10/13/2015, 5:14 PM  [place] [address] [place], [place], [address] Phone: [phone]   Fax:  [phone]  Name: SHAMAN MUSCARELLA MRN: [id number] Date of Birth: 1956-03-04

## 2015-10-17 ENCOUNTER — Encounter (HOSPITAL_COMMUNITY): Payer: Self-pay

## 2015-10-17 ENCOUNTER — Ambulatory Visit (HOSPITAL_COMMUNITY): Payer: PRIVATE HEALTH INSURANCE

## 2015-10-17 DIAGNOSIS — M629 Disorder of muscle, unspecified: Secondary | ICD-10-CM

## 2015-10-17 DIAGNOSIS — R29898 Other symptoms and signs involving the musculoskeletal system: Secondary | ICD-10-CM

## 2015-10-17 DIAGNOSIS — M25512 Pain in left shoulder: Secondary | ICD-10-CM

## 2015-10-17 DIAGNOSIS — M25612 Stiffness of left shoulder, not elsewhere classified: Secondary | ICD-10-CM

## 2015-10-17 DIAGNOSIS — M6289 Other specified disorders of muscle: Secondary | ICD-10-CM

## 2015-10-17 NOTE — Therapy (Signed)
[place], [place], [address] Phone: [phone]   Fax:  [phone]  Occupational Therapy Treatment  Patient Details  Name: Gary Cole MRN: [id number] Date of Birth: [birth year] Referring Provider: Percell Miller  Encounter Date: 10/17/2015      OT End of Session - 10/17/15 1629    Visit Number 7   Number of Visits 8   Date for OT Re-Evaluation 11/20/15  Mini reassess: 10/19/15   Authorization Type medical mutual PPO ([place]) No visit limit   OT Start Time 1520   OT Stop Time 1600   OT Time Calculation (min) 40 min   Activity Tolerance Patient tolerated treatment well   Behavior During Therapy [facility] for tasks assessed/performed      History reviewed. No pertinent past medical history.  No past surgical history on file.  There were no vitals filed for this visit.  Visit Diagnosis:  Tight fascia  Pain in joint, shoulder region, left  Shoulder stiffness, left  Shoulder weakness      Subjective Assessment - 10/17/15 1627    Subjective  S: I did a lot of work with my at work so it's really sore right now.   Currently in Pain? Yes   Pain Score 6    Pain Location Shoulder   Pain Orientation Left   Pain Descriptors / Indicators Sore   Pain Type Chronic pain            [facility] OT Assessment - 10/17/15 1627    Assessment   Diagnosis Left shoulder bursitis and adhesive capsulitis   Precautions   Precautions None                  OT Treatments/Exercises (OP) - 10/17/15 1557    Exercises   Exercises Shoulder   Shoulder Exercises: Supine   Protraction PROM;5 reps;AROM;10 reps   Horizontal ABduction PROM;5 reps;AROM;10 reps   External Rotation PROM;5 reps;AROM;10 reps   Internal Rotation PROM;5 reps;AROM;10 reps   Flexion PROM;5 reps;AROM;10 reps   ABduction PROM;5 reps;AROM;10 reps   Shoulder Exercises: Pulleys   Flexion 1 minute   ABduction 1 minute   Shoulder Exercises:  ROM/Strengthening   Other ROM/Strengthening Exercises sliding cloth up PVC pipe positioned in corner, turning to right as he reaches end range X 20 repetiitons with excellent results   Manual Therapy   Manual Therapy Myofascial release;Muscle Energy Technique   Myofascial Release Myofascial release to left upper arm, trapezius, and scapularis regions.    Muscle Energy Technique Muscle energy technique to left anterior and medial deltoid to decrease muscle spasm and increase joint ROM.                  OT Short Term Goals - 09/26/15 1613    OT SHORT TERM GOAL #1   Title Patient will be educated and independent with HEP.    Time 4   Period Weeks   Status On-going   OT SHORT TERM GOAL #2   Title Patient will return to highest level of independence with all daily and work related tasks.   Time 4   Period Weeks   Status On-going   OT SHORT TERM GOAL #3   Title Patient will increase A/ROM to [facility] to increase ability to complete tasks above shoulder level.   Time 4   Period Weeks   Status On-going   OT SHORT TERM GOAL #4   Title Patient will increase left shoulder strength to  4-/5 to increase ability to complete heavy lifting or pulling tasks at work.    Time 4   Period Weeks   Status On-going   OT SHORT TERM GOAL #5   Title Patient will decrease pain level during completing daily and work tasks to 4/10 or less.    Time 4   Period Weeks   Status On-going   OT SHORT TERM GOAL #6   Title Patient will decrease fascial restrictions to min amount.    Time 4   Period Weeks   Status On-going                  Plan - 10/17/15 1630    Clinical Impression Statement A: Added pro/ret/elev/dep and PVC sliding activity. Patient had great results with shoulder mobility during PVC activity. Pt did report pain during decent during passive stretching with flexion.    Plan P: Mini reassess. Try kinesiotape to left shoulder to help with pain and stability.         Problem  List There are no active problems to display for this patient.   Laura Essenmacher, OTR/L,CBIS  [phone]  10/17/2015, 4:32 PM  [facility] [address] [place], [place], [address] Phone: [phone]   Fax:  [phone]  Name: Gary Cole MRN: [id number] Date of Birth: 12/14/1956   

## 2015-10-20 ENCOUNTER — Ambulatory Visit (HOSPITAL_COMMUNITY): Payer: PRIVATE HEALTH INSURANCE | Admitting: Occupational Therapy

## 2015-10-24 ENCOUNTER — Ambulatory Visit (HOSPITAL_COMMUNITY): Payer: PRIVATE HEALTH INSURANCE | Attending: Orthopedic Surgery

## 2015-10-25 ENCOUNTER — Telehealth (HOSPITAL_COMMUNITY): Payer: Self-pay

## 2015-10-25 NOTE — Telephone Encounter (Signed)
10/25/15 left message about scheduling more appts.... None had been made

## 2015-11-28 ENCOUNTER — Encounter (HOSPITAL_COMMUNITY): Payer: Self-pay

## 2015-11-28 NOTE — Therapy (Signed)
[place], [place], [address] Phone: [phone]   Fax:  [phone]  Patient Details  Name: Gary Cole MRN: [id number] Date of Birth: 12/02/1956 Referring Provider:  No ref. provider found  Encounter Date: 11/28/2015 OCCUPATIONAL THERAPY DISCHARGE SUMMARY  Visits from Start of Care: 7  Current functional level related to goals / functional outcomes: OT SHORT TERM GOAL #1    Title Patient will be educated and independent with HEP.    Time 4   Period Weeks   Status On-going   OT SHORT TERM GOAL #2   Title Patient will return to highest level of independence with all daily and work related tasks.   Time 4   Period Weeks   Status On-going   OT SHORT TERM GOAL #3   Title Patient will increase A/ROM to [facility] to increase ability to complete tasks above shoulder level.   Time 4   Period Weeks   Status On-going   OT SHORT TERM GOAL #4   Title Patient will increase left shoulder strength to 4-/5 to increase ability to complete heavy lifting or pulling tasks at work.    Time 4   Period Weeks   Status On-going   OT SHORT TERM GOAL #5   Title Patient will decrease pain level during completing daily and work tasks to 4/10 or less.    Time 4   Period Weeks   Status On-going   OT SHORT TERM GOAL #6   Title Patient will decrease fascial restrictions to min amount.    Time 4   Period Weeks   Status On-going          Remaining deficits: All deficits remain as patient did not return to therapy since last appt on 10/17/15. Patient will be discharged due to lack of attendance.    Plan: Patient agrees to discharge.  Patient goals were not met. Patient is being discharged due to not returning since the last visit.  ?????       Ailene Ravel, OTR/L,CBIS  [phone]  11/28/2015, 11:36 AM  [place] [address] [place], [place], [address] Phone: [phone]   Fax:  [phone]

## 2015-11-29 ENCOUNTER — Ambulatory Visit (HOSPITAL_COMMUNITY): Payer: PRIVATE HEALTH INSURANCE | Attending: Orthopedic Surgery

## 2015-11-29 ENCOUNTER — Ambulatory Visit (HOSPITAL_COMMUNITY): Payer: PRIVATE HEALTH INSURANCE

## 2015-11-29 ENCOUNTER — Encounter (HOSPITAL_COMMUNITY): Payer: Self-pay

## 2015-11-29 DIAGNOSIS — M25511 Pain in right shoulder: Secondary | ICD-10-CM | POA: Diagnosis present

## 2015-11-29 DIAGNOSIS — M7502 Adhesive capsulitis of left shoulder: Secondary | ICD-10-CM | POA: Diagnosis present

## 2015-11-29 DIAGNOSIS — M7551 Bursitis of right shoulder: Secondary | ICD-10-CM | POA: Diagnosis present

## 2015-11-29 DIAGNOSIS — R29898 Other symptoms and signs involving the musculoskeletal system: Secondary | ICD-10-CM | POA: Insufficient documentation

## 2015-11-29 DIAGNOSIS — M25512 Pain in left shoulder: Secondary | ICD-10-CM | POA: Insufficient documentation

## 2015-11-29 DIAGNOSIS — M7552 Bursitis of left shoulder: Secondary | ICD-10-CM | POA: Diagnosis present

## 2015-11-29 DIAGNOSIS — M25612 Stiffness of left shoulder, not elsewhere classified: Secondary | ICD-10-CM | POA: Diagnosis present

## 2015-11-29 DIAGNOSIS — M629 Disorder of muscle, unspecified: Secondary | ICD-10-CM | POA: Insufficient documentation

## 2015-11-29 DIAGNOSIS — M6289 Other specified disorders of muscle: Secondary | ICD-10-CM

## 2015-11-29 NOTE — Patient Instructions (Addendum)
Perform each exercise ___10-12_____ reps. 2-3x days.  * For left arm*  Protraction - STANDING  Start by holding a wand or cane at chest height.  Next, slowly push the wand outwards in front of your body so that your elbows become fully straightened. Then, return to the original position.     Shoulder FLEXION - STANDING - PALMS UP  In the standing position, hold a wand/cane with both arms, palms up on both sides. Raise up the wand/cane allowing your unaffected arm to perform most of the effort. Your affected arm should be partially relaxed.      Internal/External ROTATION - STANDING  In the standing position, hold a wand/cane with both hands keeping your elbows bent. Move your arms and wand/cane to one side.  Your affected arm should be partially relaxed while your unaffected arm performs most of the effort.       Shoulder ABDUCTION - STANDING  While holding a wand/cane palm face up on the injured side and palm face down on the uninjured side, slowly raise up your injured arm to the side.                     Horizontal Abduction/Adduction      Straight arms holding cane at shoulder height, bring cane to right, center, left. Repeat starting to left.   Copyright  VHI. All rights reserved.    Shoulder stretches - Complete each 3 times and hold for 10 seconds each.  Flexibility: Corner Stretch   Standing in corner with hands just above shoulder level and feet ____ inches from corner, lean forward until a comfortable stretch is felt across chest. Hold ____ seconds. Repeat ____ times per set. Do ____ sets per session. Do ____ sessions per day.    Posterior Capsule Stretch   Stand or sit, one arm across body so hand rests over opposite shoulder. Gently push on crossed elbow with other hand until stretch is felt in shoulder of crossed arm. Hold ___ seconds.  Repeat ___ times per session. Do ___ sessions per day.  Copyright  VHI. All rights reserved.     Flexors Stretch, Standing   Stand near wall and slide arm up, with palm facing away from wall, by leaning toward wall. Hold ___ seconds.  Repeat ___ times per session. Do ___ sessions per day.  Copyright  VHI. All rights reserved.   Internal Rotation Across Back  Grab the end of a towel with your affected side, palm facing backwards. Grab the towel with your unaffected side and pull your affected hand across your back until you feel a stretch in the front of your shoulder. If you feel pain, pull just to the pain, do not pull through the pain. Hold. Return your affected arm to your side. Try to keep your hand/arm close to your body during the entire movement.

## 2015-11-29 NOTE — Therapy (Signed)
[place] [facility] [address] [place], [place], [address] Phone: [phone]   Fax:  [phone]  Occupational Therapy Evaluation  Patient Details  Name: Gary Cole MRN: [id number] Date of Birth: 1956/08/20 Referring Provider: Margarita Rana  Encounter Date: 11/29/2015      OT End of Session - 11/29/15 0926    Visit Number 1   Number of Visits 16   Date for OT Re-Evaluation 01/28/16  mini reassess: 12/27/15   Authorization Type [employer] no visit limit   OT Start Time 0800   OT Stop Time 0848   OT Time Calculation (min) 48 min   Activity Tolerance Patient tolerated treatment well   Behavior During Therapy [facility] for tasks assessed/performed      History reviewed. No pertinent past medical history.  No past surgical history on file.  There were no vitals filed for this visit.  Visit Diagnosis:  Adhesive capsulitis of shoulder, left - Plan: Ot plan of care cert/re-cert  Bursitis of shoulder region, left - Plan: Ot plan of care cert/re-cert  Bursitis of right shoulder - Plan: Ot plan of care cert/re-cert  Shoulder weakness - Plan: Ot plan of care cert/re-cert  Shoulder stiffness, left - Plan: Ot plan of care cert/re-cert  Tight fascia - Plan: Ot plan of care cert/re-cert  Pain of both shoulder joints - Plan: Ot plan of care cert/re-cert      Subjective Assessment - 11/29/15 0916    Subjective  S: My right arm started to hurt a little bit. Sometimes it'll get as bad as a 5/10.   Pertinent History Patient is a 59 y/o male S/P bilateral shoulder bursitis and adhesive capsulitis. Left shoulder began to have issues approximately in August 2016 when patient was using a weedeater. Recently patient has been complaining of right shoulder pain and cervical pain with decreased range of motion in neck. No noted  injury. Patient reports that he did have another MRI complete but will not know the results until Dec. 20th.  Dr. Elodia Florence has referred patient to occupational therapy for evaluation and treatment.    Special Tests FOTO Score: 54/100 (46% impaired)   Patient Stated Goals To be able to use arms normally without pain.   Currently in Pain? Yes   Pain Score 1    Pain Location Shoulder   Pain Orientation Right   Pain Descriptors / Indicators Aching   Pain Type Acute pain   Multiple Pain Sites Yes   Pain Score 3   Pain Location Shoulder   Pain Orientation Left   Pain Descriptors / Indicators Aching;Sore   Pain Type Chronic pain           [facility] OT Assessment - 11/29/15 0803    Assessment   Diagnosis Bilateral shoulder bursitis and adhesive capsulitis   Referring Provider TImothy Murphy   Assessment 01-09-16 Murphy   Precautions   Precautions None   Restrictions   Weight Bearing Restrictions No   Balance Screen   Has the patient fallen in the past 6 months No   Home  Environment   Family/patient expects to be discharged to: Private residence   Prior Function   Level of Independence Independent   Vocation Full time employment   [employer] - Lifting   ADL   ADL comments Difficulty reaching overhead or above shoulder level using left arm at home and work.  No liftinf over 15# at work at the moment.  Mobility   Mobility Status Independent   Written Expression   Dominant Hand Right   Vision - History   Baseline Vision Wears glasses all the time   Cognition   Overall Cognitive Status Within Functional Limits for tasks assessed   ROM / Strength   AROM / PROM / Strength AROM;PROM;Strength   Palpation   Palpation comment Max fascial restrictions in bilateral shoulders and cervical region.    AROM   Overall AROM Comments Assessed seated. IR/er abducted.   AROM Assessment Site Shoulder;Cervical   Right/Left Shoulder Left;Right   Right Shoulder Flexion 175 Degrees   Right Shoulder ABduction 175 Degrees   Right Shoulder Internal Rotation 70 Degrees   Right  Shoulder External Rotation 90 Degrees   Left Shoulder Flexion 112 Degrees   Left Shoulder ABduction 112 Degrees   Left Shoulder Internal Rotation 60 Degrees   Left Shoulder External Rotation 32 Degrees   Cervical Flexion 40   Cervical Extension 40   Cervical - Right Side Bend 35  WFL   Cervical - Left Side Bend 35  WFL   Cervical - Right Rotation 55   Cervical - Left Rotation 40   PROM   Overall PROM Comments Assessed supine. IR/er abducted. Right UE WFL in all ranges.    PROM Assessment Site Shoulder   Right/Left Shoulder Right;Left   Left Shoulder Flexion 133 Degrees   Left Shoulder ABduction 118 Degrees   Left Shoulder Internal Rotation 45 Degrees   Left Shoulder External Rotation 10 Degrees   Strength   Strength Assessment Site Shoulder   Right/Left Shoulder Right;Left   Right Shoulder Flexion 5/5   Right Shoulder ABduction 5/5   Right Shoulder Internal Rotation 5/5   Right Shoulder External Rotation 5/5   Left Shoulder Flexion 4-/5   Left Shoulder ABduction 3/5   Left Shoulder Internal Rotation 3/5   Left Shoulder External Rotation 3/5                  OT Treatments/Exercises (OP) - 11/29/15 1006    Exercises   Exercises Shoulder   Shoulder Exercises: Supine   Protraction PROM;Left;10 reps   Horizontal ABduction PROM;Left;10 reps   External Rotation PROM;Left;10 reps   Internal Rotation PROM;Left;10 reps   Flexion PROM;Left;10 reps   ABduction PROM;Left;10 reps   Manual Therapy   Manual Therapy Myofascial release   Manual therapy comments Manual therapy completed prior to exercises.   Myofascial Release Myofascial release to bilateral upper arms, trapezius, and scapularis regions.                OT Education - 11/29/15 [phone]    Education provided Yes   Education Details Shoulder stretches and AA/ROM for left UE.   Person(s) Educated Patient   Methods Explanation;Handout;Verbal cues   Comprehension Verbalized understanding          OT  Short Term Goals - 11/29/15 0947    OT SHORT TERM GOAL #1   Title Patient will be educated and independent with HEP to increase functional use of bilateral shoulders during daily tasks.    Time 4   Period Weeks   Status New   OT SHORT TERM GOAL #2   Title Patient will increase cervical ROM to [facility] to increase ability to look left and right when driving and scanning for traffic.    Time 4   Period Weeks   Status New   OT SHORT TERM GOAL #3   Title Patient will increase LUE  P/ROM to [facility] to increase ability to complete tasks above shoulder level.   Time 4   Period Weeks   Status New   OT SHORT TERM GOAL #4   Title Patient will increase left shoulder strength to 4-/5 to increase ability to complete heavy lifting or pulling tasks at work.    Time 4   Period Weeks   Status New   OT SHORT TERM GOAL #5   Title Patient will decrease Bilateral shoulder pain level during completing daily and work tasks to 3/10 or less.    Time 4   Period Weeks   Status New   OT SHORT TERM GOAL #6   Title Patient will decrease fascial restrictions to mod amount in bilateral shoulders to increase functional mobility during reaching tasks.    Time 4   Period Weeks   Status New           OT Long Term Goals - 11/29/15 0950    OT LONG TERM GOAL #1   Title patient will return to highest level of independence with all daily and work related tasks using bilateral UE.   Time 8   Period Weeks   Status New   OT LONG TERM GOAL #2   Title Patient will increase LUE strength to 4+/5 to increase ability to complete work related tasks with less difficulty.    Time 8   Period Weeks   Status New   OT LONG TERM GOAL #3   Title Patient will decrease pain level in bilateral shoulders to 2/10 or less when completing daily and work related tasks.   Time 8   Period Weeks   Status New   OT LONG TERM GOAL #4   Title Patient will decrease fascial restrictions in BUE to trace amount to increase functional mobility during  daily tasks.    Time 8   Period Weeks   Status New   OT LONG TERM GOAL #5   Title Patient will increase LUE A/ROM to [facility] to increase ability to complete tasks overhead with less difficulty.    Time 8   Period Weeks   Status New               Plan - 11/29/15 8381    Clinical Impression Statement A: Patient is a 59 y/o male S/P bilateral shoulder bursitis and adhesive capsulitis and cervical pain causing increased pain and fascial restrictions and decreased ROM and strength resulting in difficulty completing daily and work related tasks. Due to treating bilateral shoulders and cervical region patient will benefit most with 2 treatment time slots to allow for an appropriate amount of time to focus on defcitis.    Pt will benefit from skilled therapeutic intervention in order to improve on the following deficits (Retired) Decreased strength;Pain;Impaired UE functional use;Increased fascial restricitons;Decreased range of motion   Rehab Potential Excellent   OT Frequency 2x / week   OT Duration 8 weeks   OT Treatment/Interventions Self-care/ADL training;Iontophoresis;Therapeutic exercises;Therapeutic activities;Manual Therapy;Cryotherapy;Electrical Stimulation;Moist Heat;Ultrasound;Passive range of motion;Traction;Patient/family education   Plan P: Patient will benefit from skilled OT services to increase functional performance during bilateral UE during daily and work related tasks. Next Session: Myofascial release to bilateral shoulder and cervical region. Gental manual traction if needed. Muscle energy technique to LUE. No manual stretching required with RUE. Educated patient on self myofascial release with ball. Add PVC piping stretch for LUE. Add cervical stretches and ROM and add to HEP. Follow up on HEP given at  evaluation. Provide patient with OT evaluation and review goals.    Consulted and Agree with Plan of Care Patient        Problem List There are no active problems to  display for this patient.   [name], OTR/L,CBIS  [phone]  11/29/2015, 10:10 AM  [place] [facility] [address] [place], [place], [address] Phone: [phone]   Fax:  [phone]  Name: Gary Cole MRN: [id number] Date of Birth: 1956/01/17

## 2015-11-30 ENCOUNTER — Ambulatory Visit (HOSPITAL_COMMUNITY): Payer: PRIVATE HEALTH INSURANCE | Admitting: Occupational Therapy

## 2015-11-30 ENCOUNTER — Encounter (HOSPITAL_COMMUNITY): Payer: Self-pay | Admitting: Occupational Therapy

## 2015-11-30 DIAGNOSIS — M7502 Adhesive capsulitis of left shoulder: Secondary | ICD-10-CM | POA: Diagnosis not present

## 2015-11-30 DIAGNOSIS — M25511 Pain in right shoulder: Secondary | ICD-10-CM

## 2015-11-30 DIAGNOSIS — M25612 Stiffness of left shoulder, not elsewhere classified: Secondary | ICD-10-CM

## 2015-11-30 DIAGNOSIS — M25512 Pain in left shoulder: Secondary | ICD-10-CM

## 2015-11-30 DIAGNOSIS — M629 Disorder of muscle, unspecified: Secondary | ICD-10-CM

## 2015-11-30 DIAGNOSIS — R29898 Other symptoms and signs involving the musculoskeletal system: Secondary | ICD-10-CM

## 2015-11-30 DIAGNOSIS — M6289 Other specified disorders of muscle: Secondary | ICD-10-CM

## 2015-11-30 NOTE — Patient Instructions (Addendum)
  AROM: Lateral Neck Flexion   Slowly tilt head toward one shoulder, then the other.  Repeat __10__ times per set. Do _1___ sets per session. Do _2___ sessions per day.  http://orth.exer.us/296   Copyright  VHI. All rights reserved.  AROM: Neck Extension   Bend head backward.  Repeat _10___ times per set. Do ___1_ sets per session. Do _2___ sessions per day.  http://orth.exer.us/300   Copyright  VHI. All rights reserved.  AROM: Neck Flexion   Bend head forward.  Repeat _10___ times per set. Do __1__ sets per session. Do _2___ sessions per day.  http://orth.exer.us/298   Copyright  VHI. All rights reserved.  AROM: Neck Rotation   Turn head slowly to look over one shoulder, then the other.  Repeat __10__ times per set. Do _1___ sets per session. Do _2___ sessions per day.  http://orth.exer.us/294   Copyright  VHI. All rights reserved.     Flexibility: Neck Stretch   Grasp left arm above wrist and pull down across body while gently tilting head same direction. Hold for 3 seconds. Repeat 10 times.   Levator Scapula Stretch   Place left hand on same side shoulder blade. With other hand, gently stretch head down and away. Hold 3 seconds. Repeat 10 times.     Flexibility: Neck Retraction   Pull head straight back, keeping eyes and jaw level. *Give yourself a double chin.* Hold 3 seconds. Repeat 10 times.   Lower Cervical / Upper Thoracic Stretch   Clasp hands together in front with arms extended. Gently pull shoulder blades apart and bend head forward. Hold 3 seconds. Repeat 10 times.      Scalene Stretch, Sitting  Repeat __10_ times per session. Do _1-2__ sessions per day.  Copyright  VHI. All rights reserved.  Scalene Stretch, Sitting   Sit, one hand tucked under hip on side to be stretched, other hand over top of head. Gently pull head to side and backwards.  For more stretch, lean body in direction of head pull. Repeat _10__ times per  session. Do __2_ sessions per day.     Copyright  VHI. All rights reserved.  Levator Scapula Stretch, Sitting   Sit, one hand on same-side shoulder blade, other hand on head. Gently pull head down and away.  Repeat _10__ times per session. Do __2_ sessions per day.

## 2015-11-30 NOTE — Therapy (Signed)
[facility] [address] [place], [place], [address] Phone: [phone]   Fax:  [phone]  Occupational Therapy Treatment  Patient Details  Name: Gary Cole MRN: [id number] Date of Birth: October 02, 1956 Referring Provider: Margarita Rana  Encounter Date: 11/30/2015      OT End of Session - 11/30/15 1659    Visit Number 2   Number of Visits 16   Date for OT Re-Evaluation 01/28/16  mini reassess: 12/27/15   Authorization Type [employer] no visit limit   OT Start Time 1600   OT Stop Time 1651   OT Time Calculation (min) 51 min   Activity Tolerance Patient tolerated treatment well   Behavior During Therapy [facility] for tasks assessed/performed      History reviewed. No pertinent past medical history.  No past surgical history on file.  There were no vitals filed for this visit.  Visit Diagnosis:  Shoulder weakness  Shoulder stiffness, left  Tight fascia  Pain of both shoulder joints      Subjective Assessment - 11/30/15 1601    Subjective  S: This morning my right shoulder was hurting really badly.    Currently in Pain? Yes   Pain Score 2    Pain Location Shoulder   Pain Orientation Right   Pain Descriptors / Indicators Aching   Pain Type Acute pain            [facility] OT Assessment - 11/30/15 1630    Assessment   Diagnosis Bilateral shoulder bursitis and adhesive capsulitis   Precautions   Precautions None                  OT Treatments/Exercises (OP) - 11/30/15 1603    Exercises   Exercises Shoulder;Neck   Neck Exercises: Stretches   Upper Trapezius Stretch 2 reps;10 seconds   Levator Stretch 2 reps;10 seconds   Neck Stretch 2 reps;10 seconds   Lower Cervical/Upper Thoracic Stretch 1 rep;10 seconds   Shoulder Exercises: Supine   Protraction PROM;10 reps;AAROM;12 reps   Horizontal ABduction PROM;10 reps;AAROM;12 reps   External Rotation PROM;10 reps;AAROM;12 reps   Internal  Rotation PROM;10 reps;AAROM;12 reps   Flexion PROM;10 reps;AAROM;12 reps   ABduction PROM;10 reps;AAROM;12 reps   Shoulder Exercises: Standing   Other Standing Exercises PVC pipe sliding exercise, working on glenohumeral joint mobility   Manual Therapy   Manual Therapy Myofascial release   Manual therapy comments Manual therapy completed prior to exercises.   Myofascial Release Myofascial release to bilateral upper arms, trapezius, and scapularis regions.                 OT Education - 11/30/15 1659    Education provided Yes   Education Details cervical A/ROM, cervical stretches   Person(s) Educated Patient   Methods Explanation;Demonstration;Handout   Comprehension Verbalized understanding;Returned demonstration          OT Short Term Goals - 11/30/15 1710    OT SHORT TERM GOAL #1   Title Patient will be educated and independent with HEP to increase functional use of bilateral shoulders during daily tasks.    Time 4   Period Weeks   Status On-going   OT SHORT TERM GOAL #2   Title Patient will increase cervical ROM to [facility] to increase ability to look left and right when driving and scanning for traffic.    Time 4   Period Weeks   Status On-going   OT SHORT TERM GOAL #  3   Title Patient will increase LUE P/ROM to [facility] to increase ability to complete tasks above shoulder level.   Time 4   Period Weeks   Status On-going   OT SHORT TERM GOAL #4   Title Patient will increase left shoulder strength to 4-/5 to increase ability to complete heavy lifting or pulling tasks at work.    Time 4   Period Weeks   Status On-going   OT SHORT TERM GOAL #5   Title Patient will decrease Bilateral shoulder pain level during completing daily and work tasks to 3/10 or less.    Time 4   Period Weeks   Status On-going   OT SHORT TERM GOAL #6   Title Patient will decrease fascial restrictions to mod amount in bilateral shoulders to increase functional mobility during reaching tasks.     Time 4   Period Weeks   Status On-going           OT Long Term Goals - 11/30/15 1710    OT LONG TERM GOAL #1   Title patient will return to highest level of independence with all daily and work related tasks using bilateral UE.   Time 8   Period Weeks   Status On-going   OT LONG TERM GOAL #2   Title Patient will increase LUE strength to 4+/5 to increase ability to complete work related tasks with less difficulty.    Time 8   Period Weeks   Status On-going   OT LONG TERM GOAL #3   Title Patient will decrease pain level in bilateral shoulders to 2/10 or less when completing daily and work related tasks.   Time 8   Period Weeks   Status On-going   OT LONG TERM GOAL #4   Title Patient will decrease fascial restrictions in BUE to trace amount to increase functional mobility during daily tasks.    Time 8   Period Weeks   Status On-going   OT LONG TERM GOAL #5   Title Patient will increase LUE A/ROM to [facility] to increase ability to complete tasks overhead with less difficulty.    Time 8   Period Weeks   Status On-going               Plan - 11/30/15 1700    Clinical Impression Statement A: Initiated myofascial release, P/ROM, LUE AA/ROM, cervical A/ROM, and cervical stretches. Pt reports he has worked 2 shifts in a row and is experiencing more soreness in his right shoulder due to extended use over a longer period of time. Provided pt with cervical ROM and cervical stretching HEP.    Plan P: Follow-up on cervical ROM and stretching HEP. Add AA/ROM exercises in standing, continue working towards increasing ROM during passive stretching, add muscle energy technique if appropriate.         Problem List There are no active problems to display for this patient.   Ezra Sites, OTR/L  [phone]  11/30/2015, 5:11 PM  [facility] [address] [place], [place], [address] Phone: [phone]   Fax:  [phone]  Name: BANNON GIAMMARCO MRN: [id number] Date of Birth: 1956/06/02

## 2015-12-05 ENCOUNTER — Ambulatory Visit (HOSPITAL_COMMUNITY): Payer: PRIVATE HEALTH INSURANCE

## 2015-12-05 ENCOUNTER — Encounter (HOSPITAL_COMMUNITY): Payer: Self-pay

## 2015-12-05 DIAGNOSIS — M629 Disorder of muscle, unspecified: Secondary | ICD-10-CM

## 2015-12-05 DIAGNOSIS — M25512 Pain in left shoulder: Secondary | ICD-10-CM

## 2015-12-05 DIAGNOSIS — M25612 Stiffness of left shoulder, not elsewhere classified: Secondary | ICD-10-CM

## 2015-12-05 DIAGNOSIS — M25511 Pain in right shoulder: Secondary | ICD-10-CM

## 2015-12-05 DIAGNOSIS — M6289 Other specified disorders of muscle: Secondary | ICD-10-CM

## 2015-12-05 DIAGNOSIS — M7502 Adhesive capsulitis of left shoulder: Secondary | ICD-10-CM | POA: Diagnosis not present

## 2015-12-05 DIAGNOSIS — R29898 Other symptoms and signs involving the musculoskeletal system: Secondary | ICD-10-CM

## 2015-12-05 NOTE — Therapy (Signed)
[facility] [address] [place], [place], [address] Phone: [phone]   Fax:  [phone]  Occupational Therapy Treatment  Patient Details  Name: Gary Cole MRN: [id number] Date of Birth: 1956/11/12 Referring Provider: Margarita Rana  Encounter Date: 12/05/2015      OT End of Session - 12/05/15 1658    Visit Number 3   Number of Visits 16   Date for OT Re-Evaluation 01/28/16  mini reassess: 12/27/15   Authorization Type [employer] no visit limit   OT Start Time 1550   OT Stop Time 1640   OT Time Calculation (min) 50 min   Activity Tolerance Patient tolerated treatment well   Behavior During Therapy [facility] for tasks assessed/performed      History reviewed. No pertinent past medical history.  No past surgical history on file.  There were no vitals filed for this visit.  Visit Diagnosis:  Shoulder weakness  Shoulder stiffness, left  Tight fascia  Pain of both shoulder joints      Subjective Assessment - 12/05/15 1617    Subjective  S: We were short two people today at work so I was using my arm a lot.    Currently in Pain? Yes   Pain Score 2    Pain Location Shoulder   Pain Orientation Left;Right   Pain Descriptors / Indicators Aching   Pain Type Acute pain            [facility] OT Assessment - 12/05/15 1618    Assessment   Diagnosis Bilateral shoulder bursitis and adhesive capsulitis   Precautions   Precautions None                  OT Treatments/Exercises (OP) - 12/05/15 1618    Exercises   Exercises Neck;Shoulder   Shoulder Exercises: Supine   Protraction PROM;10 reps;AAROM;12 reps   Horizontal ABduction PROM;10 reps;AAROM;12 reps   External Rotation PROM;10 reps;AAROM;12 reps   Internal Rotation PROM;10 reps;AAROM;12 reps   Flexion PROM;10 reps;AAROM;12 reps   ABduction PROM;10 reps;AAROM;12 reps   Shoulder Exercises: Standing   Protraction AAROM;12 reps   Horizontal  ABduction AAROM;12 reps   External Rotation AAROM;12 reps   Internal Rotation AAROM;12 reps   Flexion AAROM;12 reps   ABduction AAROM;12 reps   Shoulder Exercises: ROM/Strengthening   UBE (Upper Arm Bike) Level 1 2' forward 2' reverse   Wall Wash 1'   Proximal Shoulder Strengthening, Supine 12X no rest breaks   Proximal Shoulder Strengthening, Seated 12X no rest breaks   Other ROM/Strengthening Exercises sliding cloth up PVC pipe positioned in corner, turning to right as he reaches end range X 20 repetiitons with excellent results   Manual Therapy   Manual Therapy Myofascial release;Muscle Energy Technique   Manual therapy comments Manual therapy completed prior to exercises.   Myofascial Release Myofascial release to bilateral upper arms, trapezius, and scapularis regions.    Muscle Energy Technique Muscle energy technique to left anterior and medial deltoid to decrease muscle spasm and increase joint ROM.                  OT Short Term Goals - 11/30/15 1710    OT SHORT TERM GOAL #1   Title Patient will be educated and independent with HEP to increase functional use of bilateral shoulders during daily tasks.    Time 4   Period Weeks   Status On-going   OT SHORT TERM GOAL #2  Title Patient will increase cervical ROM to [facility] to increase ability to look left and right when driving and scanning for traffic.    Time 4   Period Weeks   Status On-going   OT SHORT TERM GOAL #3   Title Patient will increase LUE P/ROM to [facility] to increase ability to complete tasks above shoulder level.   Time 4   Period Weeks   Status On-going   OT SHORT TERM GOAL #4   Title Patient will increase left shoulder strength to 4-/5 to increase ability to complete heavy lifting or pulling tasks at work.    Time 4   Period Weeks   Status On-going   OT SHORT TERM GOAL #5   Title Patient will decrease Bilateral shoulder pain level during completing daily and work tasks to 3/10 or less.    Time 4    Period Weeks   Status On-going   OT SHORT TERM GOAL #6   Title Patient will decrease fascial restrictions to mod amount in bilateral shoulders to increase functional mobility during reaching tasks.    Time 4   Period Weeks   Status On-going           OT Long Term Goals - 11/30/15 1710    OT LONG TERM GOAL #1   Title patient will return to highest level of independence with all daily and work related tasks using bilateral UE.   Time 8   Period Weeks   Status On-going   OT LONG TERM GOAL #2   Title Patient will increase LUE strength to 4+/5 to increase ability to complete work related tasks with less difficulty.    Time 8   Period Weeks   Status On-going   OT LONG TERM GOAL #3   Title Patient will decrease pain level in bilateral shoulders to 2/10 or less when completing daily and work related tasks.   Time 8   Period Weeks   Status On-going   OT LONG TERM GOAL #4   Title Patient will decrease fascial restrictions in BUE to trace amount to increase functional mobility during daily tasks.    Time 8   Period Weeks   Status On-going   OT LONG TERM GOAL #5   Title Patient will increase LUE A/ROM to [facility] to increase ability to complete tasks overhead with less difficulty.    Time 8   Period Weeks   Status On-going               Plan - 12/05/15 1659    Clinical Impression Statement A: Pt reports that he is completing cervical HEP at home without difficulty. Completed AA/ROM standing and added UBE bike. Added muscle energy technique and pt was able to achieve a slight increase in ROM.    Plan P: Continue with muscle energy technique to increase LUE ROM during passive stretching. Add functional reaching task.         Problem List There are no active problems to display for this patient.   [name], OTR/L,CBIS  [phone]  12/05/2015, 5:08 PM  [facility] [address] [place], [place], [address] Phone:  [phone]   Fax:  [phone]  Name: Gary Cole MRN: [id number] Date of Birth: 1956/06/03

## 2015-12-07 ENCOUNTER — Encounter (HOSPITAL_COMMUNITY): Payer: No Typology Code available for payment source | Admitting: Occupational Therapy

## 2015-12-12 ENCOUNTER — Encounter (HOSPITAL_COMMUNITY): Payer: No Typology Code available for payment source | Admitting: Specialist

## 2015-12-14 ENCOUNTER — Ambulatory Visit (HOSPITAL_COMMUNITY): Payer: PRIVATE HEALTH INSURANCE

## 2015-12-14 ENCOUNTER — Encounter (HOSPITAL_COMMUNITY): Payer: Self-pay

## 2015-12-14 DIAGNOSIS — M7502 Adhesive capsulitis of left shoulder: Secondary | ICD-10-CM | POA: Diagnosis not present

## 2015-12-14 DIAGNOSIS — R29898 Other symptoms and signs involving the musculoskeletal system: Secondary | ICD-10-CM

## 2015-12-14 DIAGNOSIS — M25612 Stiffness of left shoulder, not elsewhere classified: Secondary | ICD-10-CM

## 2015-12-14 DIAGNOSIS — M6289 Other specified disorders of muscle: Secondary | ICD-10-CM

## 2015-12-14 DIAGNOSIS — M629 Disorder of muscle, unspecified: Secondary | ICD-10-CM

## 2015-12-14 NOTE — Therapy (Signed)
[facility] [address] [place], [place], [address] Phone: [phone]   Fax:  [phone]  Occupational Therapy Treatment  Patient Details  Name: BRIGHAM COBBINS MRN: [id number] Date of Birth: 02/22/1956 Referring Provider: Margarita Rana  Encounter Date: 12/14/2015      OT End of Session - 12/14/15 1730    Visit Number 4   Number of Visits 16   Date for OT Re-Evaluation 01/28/16  mini reassess: 12/27/15   Authorization Type [employer] no visit limit   OT Start Time 1640   OT Stop Time 1730   OT Time Calculation (min) 50 min   Activity Tolerance Patient tolerated treatment well   Behavior During Therapy [facility] for tasks assessed/performed      History reviewed. No pertinent past medical history.  No past surgical history on file.  There were no vitals filed for this visit.  Visit Diagnosis:  Shoulder stiffness, left  Shoulder weakness  Tight fascia          [facility] OT Assessment - 12/14/15 1707    Assessment   Diagnosis Bilateral shoulder bursitis and adhesive capsulitis   Precautions   Precautions None                  OT Treatments/Exercises (OP) - 12/14/15 1707    Exercises   Exercises Neck;Shoulder   Shoulder Exercises: Supine   Protraction PROM;5 reps;AAROM;15 reps   Horizontal ABduction PROM;5 reps;AAROM;15 reps   External Rotation PROM;5 reps;AAROM;15 reps   Internal Rotation PROM;5 reps;AAROM;15 reps   Flexion PROM;5 reps;AAROM;15 reps   ABduction PROM;5 reps;AAROM;15 reps   Shoulder Exercises: Standing   Protraction AAROM;15 reps   Horizontal ABduction AAROM;15 reps   External Rotation AAROM;15 reps   Internal Rotation AAROM;15 reps   Flexion AAROM;15 reps   ABduction AAROM;15 reps   Shoulder Exercises: ROM/Strengthening   UBE (Upper Arm Bike) Level 1 3' forward 3' reverse   Over Head Lace 2'   Proximal Shoulder Strengthening, Supine 15X no rest breaks   Proximal  Shoulder Strengthening, Seated 15X no rest breaks   Shoulder Exercises: Stretch   Other Shoulder Stretches Sidelying sleeper stretch; internal and external rotation; 10 second hold; 3 times.   Manual Therapy   Manual Therapy Myofascial release;Muscle Energy Technique   Manual therapy comments Manual therapy completed prior to exercises.   Myofascial Release Myofascial release to bilateral upper arms, trapezius, and scapularis regions.    Muscle Energy Technique Muscle energy technique to left anterior and medial deltoid to decrease muscle spasm and increase joint ROM.                OT Education - 12/14/15 1730    Education provided Yes   Education Details Sleeper stretch for [employer]) Educated Patient   Methods Explanation;Demonstration;Verbal cues;Handout   Comprehension Returned demonstration;Verbalized understanding          OT Short Term Goals - 11/30/15 1710    OT SHORT TERM GOAL #1   Title Patient will be educated and independent with HEP to increase functional use of bilateral shoulders during daily tasks.    Time 4   Period Weeks   Status On-going   OT SHORT TERM GOAL #2   Title Patient will increase cervical ROM to [facility] to increase ability to look left and right when driving and scanning for traffic.    Time 4   Period Weeks   Status On-going  OT SHORT TERM GOAL #3   Title Patient will increase LUE P/ROM to [facility] to increase ability to complete tasks above shoulder level.   Time 4   Period Weeks   Status On-going   OT SHORT TERM GOAL #4   Title Patient will increase left shoulder strength to 4-/5 to increase ability to complete heavy lifting or pulling tasks at work.    Time 4   Period Weeks   Status On-going   OT SHORT TERM GOAL #5   Title Patient will decrease Bilateral shoulder pain level during completing daily and work tasks to 3/10 or less.    Time 4   Period Weeks   Status On-going   OT SHORT TERM GOAL #6   Title Patient will decrease  fascial restrictions to mod amount in bilateral shoulders to increase functional mobility during reaching tasks.    Time 4   Period Weeks   Status On-going           OT Long Term Goals - 11/30/15 1710    OT LONG TERM GOAL #1   Title patient will return to highest level of independence with all daily and work related tasks using bilateral UE.   Time 8   Period Weeks   Status On-going   OT LONG TERM GOAL #2   Title Patient will increase LUE strength to 4+/5 to increase ability to complete work related tasks with less difficulty.    Time 8   Period Weeks   Status On-going   OT LONG TERM GOAL #3   Title Patient will decrease pain level in bilateral shoulders to 2/10 or less when completing daily and work related tasks.   Time 8   Period Weeks   Status On-going   OT LONG TERM GOAL #4   Title Patient will decrease fascial restrictions in BUE to trace amount to increase functional mobility during daily tasks.    Time 8   Period Weeks   Status On-going   OT LONG TERM GOAL #5   Title Patient will increase LUE A/ROM to [facility] to increase ability to complete tasks overhead with less difficulty.    Time 8   Period Weeks   Status On-going               Plan - 12/14/15 1731    Clinical Impression Statement A: Increased AA/ROM supine and standing to 15 repetitions. Added overhead lacing to focus on functional reaching. Pt required min VC for form and technique. Pt experienced fascial release during muscle energy technique in LUE. Slight increase in ROM during passive stretching.   Plan P: Add internal rotation stretch using towel or pvc pipe horizontally then progress to vertically if able to tolerate.         Problem List There are no active problems to display for this patient.   [name], OTR/L,CBIS  [phone]  12/14/2015, 5:35 PM   [facility] [address] [place], [place], [address] Phone: [phone]   Fax:   [phone]  Name: ANTOINE VANDERMEULEN MRN: [id number] Date of Birth: 1956-05-14

## 2015-12-14 NOTE — Patient Instructions (Signed)
Sleeper stretch for shoulder IRs  Side-lying on involved side on a firm surface   Roll back a bit (20-30 dg) so the shoulder is in the same plane as the scapula  Place arm in 90 degrees shoulder abduction + elbow flexion  Move the forearm down into IR (keep shoulder, arm, elbow on the surface)  Once you feel a pull in the back of the shoulder, use the uninvolved hand to (gently) push down beyond this point   Hold stretch for 10-30 seconds   Slowly raise the stretched arm back to the neutral position  Progression: once you can move your forearm almost all the way to the mat, use the uninvolved arm to give some slight resistance (down) on the involved arm as you externally rotate it (back up) to neutral position. Hold the resistance for 5-10". Then move your involved arm back up to the starting position.

## 2015-12-19 ENCOUNTER — Ambulatory Visit (HOSPITAL_COMMUNITY): Payer: PRIVATE HEALTH INSURANCE | Admitting: Specialist

## 2015-12-19 DIAGNOSIS — M25512 Pain in left shoulder: Secondary | ICD-10-CM

## 2015-12-19 DIAGNOSIS — M25511 Pain in right shoulder: Secondary | ICD-10-CM

## 2015-12-19 DIAGNOSIS — M7502 Adhesive capsulitis of left shoulder: Secondary | ICD-10-CM | POA: Diagnosis not present

## 2015-12-19 DIAGNOSIS — M629 Disorder of muscle, unspecified: Secondary | ICD-10-CM

## 2015-12-19 DIAGNOSIS — M25612 Stiffness of left shoulder, not elsewhere classified: Secondary | ICD-10-CM

## 2015-12-19 DIAGNOSIS — R29898 Other symptoms and signs involving the musculoskeletal system: Secondary | ICD-10-CM

## 2015-12-19 DIAGNOSIS — M6289 Other specified disorders of muscle: Secondary | ICD-10-CM

## 2015-12-19 NOTE — Patient Instructions (Signed)
External / Internal Rotator Cuff Stretch, Standing    Stand and reach right arm over head, left arm behind back. Clasp hands, if possible. Use belt or small towel if hands cannot clasp. Hold 15__ seconds. Repeat __10_ times per session. Do __2_ sessions per day.  Copyright  V[employer]. All rights reserved.

## 2015-12-19 NOTE — Therapy (Signed)
[facility] [address] [place], [place], [address] Phone: [phone]   Fax:  [phone]  Occupational Therapy Treatment  Patient Details  Name: MARQUARIUS LOFTON MRN: [id number] Date of Birth: 1956/04/04 Referring Provider: Margarita Rana  Encounter Date: 12/19/2015      OT End of Session - 12/19/15 1645    Visit Number 5   Number of Visits 16   Date for OT Re-Evaluation 01/28/16  mini reassess on 12/27/15   Authorization Type [employer] no visit limit   OT Start Time 1600   OT Stop Time 1647   OT Time Calculation (min) 47 min   Activity Tolerance Patient tolerated treatment well   Behavior During Therapy [facility] for tasks assessed/performed      No past medical history on file.  No past surgical history on file.  There were no vitals filed for this visit.  Visit Diagnosis:  Shoulder stiffness, left  Shoulder weakness  Tight fascia  Pain of both shoulder joints          [facility] OT Assessment - 12/19/15 0001    Assessment   Diagnosis Bilateral shoulder bursitis and adhesive capsulitis   Precautions   Precautions None                  OT Treatments/Exercises (OP) - 12/19/15 0001    Exercises   Exercises Neck;Shoulder   Shoulder Exercises: Supine   Protraction PROM;5 reps;AAROM;15 reps   Horizontal ABduction PROM;5 reps;AAROM;15 reps   External Rotation PROM;5 reps;AAROM;15 reps   Internal Rotation PROM;5 reps;AAROM;15 reps   Flexion PROM;5 reps;AAROM;15 reps   ABduction PROM;5 reps;AAROM;15 reps   Shoulder Exercises: Standing   Protraction AAROM;15 reps   Horizontal ABduction AAROM;15 reps   External Rotation AAROM;15 reps   Internal Rotation AAROM;15 reps   Flexion AAROM;15 reps   ABduction AAROM;15 reps   Shoulder Exercises: ROM/Strengthening   UBE (Upper Arm Bike) level 2 3' forward and 3' reverse   Over Head Lace 2'   Wall Pushups --  begin next session   Proximal  Shoulder Strengthening, Supine 15X no rest breaks   Proximal Shoulder Strengthening, Seated 15X no rest breaks   Other ROM/Strengthening Exercises sliding cloth up PVC pipe positioned in corner, turning to right as he reaches end range X 20 repetiitons with excellent resultsfor flexion.  completed 15 repetitions standing facing pipe vs corner to improve abduction.  this stretch is more difficult for patient   Shoulder Exercises: Stretch   Internal Rotation Stretch 5 reps   Internal Rotation Stretch Limitations completed with towel vs dowel   Manual Therapy   Manual Therapy Myofascial release   Manual therapy comments Manual therapy completed prior to exercises.   Myofascial Release Myofascial release to bilateral upper arms, trapezius, and scapularis regions.                   OT Short Term Goals - 11/30/15 1710    OT SHORT TERM GOAL #1   Title Patient will be educated and independent with HEP to increase functional use of bilateral shoulders during daily tasks.    Time 4   Period Weeks   Status On-going   OT SHORT TERM GOAL #2   Title Patient will increase cervical ROM to [facility] to increase ability to look left and right when driving and scanning for traffic.    Time 4   Period Weeks   Status On-going   OT  SHORT TERM GOAL #3   Title Patient will increase LUE P/ROM to [facility] to increase ability to complete tasks above shoulder level.   Time 4   Period Weeks   Status On-going   OT SHORT TERM GOAL #4   Title Patient will increase left shoulder strength to 4-/5 to increase ability to complete heavy lifting or pulling tasks at work.    Time 4   Period Weeks   Status On-going   OT SHORT TERM GOAL #5   Title Patient will decrease Bilateral shoulder pain level during completing daily and work tasks to 3/10 or less.    Time 4   Period Weeks   Status On-going   OT SHORT TERM GOAL #6   Title Patient will decrease fascial restrictions to mod amount in bilateral shoulders to increase  functional mobility during reaching tasks.    Time 4   Period Weeks   Status On-going           OT Long Term Goals - 11/30/15 1710    OT LONG TERM GOAL #1   Title patient will return to highest level of independence with all daily and work related tasks using bilateral UE.   Time 8   Period Weeks   Status On-going   OT LONG TERM GOAL #2   Title Patient will increase LUE strength to 4+/5 to increase ability to complete work related tasks with less difficulty.    Time 8   Period Weeks   Status On-going   OT LONG TERM GOAL #3   Title Patient will decrease pain level in bilateral shoulders to 2/10 or less when completing daily and work related tasks.   Time 8   Period Weeks   Status On-going   OT LONG TERM GOAL #4   Title Patient will decrease fascial restrictions in BUE to trace amount to increase functional mobility during daily tasks.    Time 8   Period Weeks   Status On-going   OT LONG TERM GOAL #5   Title Patient will increase LUE A/ROM to [facility] to increase ability to complete tasks overhead with less difficulty.    Time 8   Period Weeks   Status On-going               Plan - 12/19/15 1646    Clinical Impression Statement A:  added abduction pvc slide for improved abduction stretch.  educated patient on internal rotation stretch and issued for HEP.   Plan P;  attempt A/ROM in supine, add wall push up for greater strength and mobility of shoulder.         Problem List There are no active problems to display for this patient.   Shirlean Mylar, OTR/L [phone]  12/19/2015, 4:48 PM  [facility] [address] [place], [place], [address] Phone: [phone]   Fax:  [phone]  Name: JAHAAD PENADO MRN: [id number] Date of Birth: 1956/07/19

## 2015-12-21 ENCOUNTER — Encounter (HOSPITAL_COMMUNITY): Payer: Self-pay

## 2015-12-21 ENCOUNTER — Ambulatory Visit (HOSPITAL_COMMUNITY): Payer: PRIVATE HEALTH INSURANCE

## 2015-12-21 DIAGNOSIS — M6289 Other specified disorders of muscle: Secondary | ICD-10-CM

## 2015-12-21 DIAGNOSIS — M25612 Stiffness of left shoulder, not elsewhere classified: Secondary | ICD-10-CM

## 2015-12-21 DIAGNOSIS — M25512 Pain in left shoulder: Secondary | ICD-10-CM

## 2015-12-21 DIAGNOSIS — M25511 Pain in right shoulder: Secondary | ICD-10-CM

## 2015-12-21 DIAGNOSIS — M629 Disorder of muscle, unspecified: Secondary | ICD-10-CM

## 2015-12-21 DIAGNOSIS — R29898 Other symptoms and signs involving the musculoskeletal system: Secondary | ICD-10-CM

## 2015-12-21 DIAGNOSIS — M7502 Adhesive capsulitis of left shoulder: Secondary | ICD-10-CM | POA: Diagnosis not present

## 2015-12-21 NOTE — Therapy (Signed)
[place] [facility] [address] [place], [place], [address] Phone: [phone]   Fax:  [phone]  Occupational Therapy Treatment  Patient Details  Name: Gary Cole MRN: [id number] Date of Birth: 1956/09/22 Referring Provider: Margarita Rana  Encounter Date: 12/21/2015      OT End of Session - 12/21/15 1827    Visit Number 6   Number of Visits 16   Date for OT Re-Evaluation 01/28/16  mini reassess on 12/27/15   Authorization Type [employer] no visit limit   OT Start Time 1655  pt arrived late   OT Stop Time 1730   OT Time Calculation (min) 35 min   Activity Tolerance Patient tolerated treatment well   Behavior During Therapy [facility] for tasks assessed/performed      History reviewed. No pertinent past medical history.  No past surgical history on file.  There were no vitals filed for this visit.  Visit Diagnosis:  Shoulder stiffness, left  Shoulder weakness  Tight fascia  Pain of both shoulder joints      Subjective Assessment - 12/21/15 1721    Subjective  S: My left arm feels fine but my right arm is killing me today.   Currently in Pain? Yes   Pain Score 5    Pain Location Shoulder   Pain Orientation Right   Pain Descriptors / Indicators Aching   Pain Type Acute pain            [facility] OT Assessment - 12/21/15 1721    Assessment   Diagnosis Bilateral shoulder bursitis and adhesive capsulitis   Precautions   Precautions None                  OT Treatments/Exercises (OP) - 12/21/15 1721    Exercises   Exercises Neck;Shoulder   Shoulder Exercises: Supine   Protraction PROM;5 reps;AROM;10 reps   Horizontal ABduction PROM;5 reps;AROM;10 reps   External Rotation PROM;5 reps;AROM;10 reps   Internal Rotation PROM;5 reps;AROM;10 reps   Flexion PROM;5 reps;AROM;10 reps   ABduction PROM;5 reps;AROM;10 reps   Shoulder Exercises: Standing   Extension Theraband;12 reps   Theraband  Level (Shoulder Extension) Level 3 (Green)   Row [employer] reps   Theraband Level (Shoulder Row) Level 3 (Green)   Retraction Theraband;12 reps   Theraband Level (Shoulder Retraction) Level 3 (Green)   Shoulder Exercises: ROM/Strengthening   Wall Pushups 10 reps   Proximal Shoulder Strengthening, Supine 15X no rest breaks   Manual Therapy   Manual Therapy Myofascial release;Muscle Energy Technique   Manual therapy comments Manual therapy completed prior to exercises.   Myofascial Release Myofascial release to bilateral upper arms, trapezius, and scapularis regions.    Muscle Energy Technique Muscle energy technique to left anterior and medial deltoid to decrease muscle spasm and increase joint ROM.                  OT Short Term Goals - 11/30/15 1710    OT SHORT TERM GOAL #1   Title Patient will be educated and independent with HEP to increase functional use of bilateral shoulders during daily tasks.    Time 4   Period Weeks   Status On-going   OT SHORT TERM GOAL #2   Title Patient will increase cervical ROM to [facility] to increase ability to look left and right when driving and scanning for traffic.    Time 4   Period Weeks   Status On-going  OT SHORT TERM GOAL #3   Title Patient will increase LUE P/ROM to [facility] to increase ability to complete tasks above shoulder level.   Time 4   Period Weeks   Status On-going   OT SHORT TERM GOAL #4   Title Patient will increase left shoulder strength to 4-/5 to increase ability to complete heavy lifting or pulling tasks at work.    Time 4   Period Weeks   Status On-going   OT SHORT TERM GOAL #5   Title Patient will decrease Bilateral shoulder pain level during completing daily and work tasks to 3/10 or less.    Time 4   Period Weeks   Status On-going   OT SHORT TERM GOAL #6   Title Patient will decrease fascial restrictions to mod amount in bilateral shoulders to increase functional mobility during reaching tasks.    Time 4    Period Weeks   Status On-going           OT Long Term Goals - 11/30/15 1710    OT LONG TERM GOAL #1   Title patient will return to highest level of independence with all daily and work related tasks using bilateral UE.   Time 8   Period Weeks   Status On-going   OT LONG TERM GOAL #2   Title Patient will increase LUE strength to 4+/5 to increase ability to complete work related tasks with less difficulty.    Time 8   Period Weeks   Status On-going   OT LONG TERM GOAL #3   Title Patient will decrease pain level in bilateral shoulders to 2/10 or less when completing daily and work related tasks.   Time 8   Period Weeks   Status On-going   OT LONG TERM GOAL #4   Title Patient will decrease fascial restrictions in BUE to trace amount to increase functional mobility during daily tasks.    Time 8   Period Weeks   Status On-going   OT LONG TERM GOAL #5   Title Patient will increase LUE A/ROM to [facility] to increase ability to complete tasks overhead with less difficulty.    Time 8   Period Weeks   Status On-going               Plan - 12/21/15 1828    Clinical Impression Statement A: Added wall push ups and A/ROM supine and standing. VC for form and technique.   Plan P: Add countertop push-ups.        Problem List There are no active problems to display for this patient.   [name], OTR/L,CBIS  [phone]  12/21/2015, 6:31 PM  [facility] [address] [place], [place], [address] Phone: [phone]   Fax:  [phone]  Name: KENZO OZMENT MRN: [id number] Date of Birth: [birth year]

## 2015-12-26 ENCOUNTER — Ambulatory Visit (HOSPITAL_COMMUNITY): Payer: PRIVATE HEALTH INSURANCE

## 2015-12-28 ENCOUNTER — Ambulatory Visit (HOSPITAL_COMMUNITY): Payer: PRIVATE HEALTH INSURANCE | Attending: Orthopedic Surgery | Admitting: Occupational Therapy

## 2015-12-28 ENCOUNTER — Encounter (HOSPITAL_COMMUNITY): Payer: Self-pay | Admitting: Occupational Therapy

## 2015-12-28 DIAGNOSIS — R29898 Other symptoms and signs involving the musculoskeletal system: Secondary | ICD-10-CM | POA: Insufficient documentation

## 2015-12-28 DIAGNOSIS — M629 Disorder of muscle, unspecified: Secondary | ICD-10-CM | POA: Insufficient documentation

## 2015-12-28 DIAGNOSIS — M25612 Stiffness of left shoulder, not elsewhere classified: Secondary | ICD-10-CM | POA: Insufficient documentation

## 2015-12-28 DIAGNOSIS — M25512 Pain in left shoulder: Secondary | ICD-10-CM | POA: Diagnosis present

## 2015-12-28 DIAGNOSIS — M25511 Pain in right shoulder: Secondary | ICD-10-CM | POA: Insufficient documentation

## 2015-12-28 DIAGNOSIS — M6289 Other specified disorders of muscle: Secondary | ICD-10-CM

## 2015-12-28 NOTE — Therapy (Signed)
[place] [facility] [address] [place], [place], [address] Phone: [phone]   Fax:  [phone]  Occupational Therapy Reassessment and Treatment  Patient Details  Name: Gary Cole MRN: [id number] Date of Birth: 01/24/1956 Referring Provider: Margarita Rana  Encounter Date: 12/28/2015      OT End of Session - 12/28/15 1649    Visit Number 7   Number of Visits 16   Date for OT Re-Evaluation 01/28/16   Authorization Type [employer] no visit limit   OT Start Time 1604   OT Stop Time 1649   OT Time Calculation (min) 45 min   Activity Tolerance Patient tolerated treatment well   Behavior During Therapy [facility] for tasks assessed/performed      History reviewed. No pertinent past medical history.  No past surgical history on file.  There were no vitals filed for this visit.  Visit Diagnosis:  Shoulder stiffness, left  Shoulder weakness  Tight fascia  Pain of both shoulder joints      Subjective Assessment - 12/28/15 1605    Subjective  S: My right arm was really hurting this weekend.    Currently in Pain? Yes   Pain Score 2    Pain Location Shoulder   Pain Orientation Right   Pain Descriptors / Indicators Aching   Pain Type Acute pain           [facility] OT Assessment - 12/28/15 1625    Assessment   Diagnosis Bilateral shoulder bursitis and adhesive capsulitis   Precautions   Precautions None   AROM   Overall AROM Comments Assessed seated. IR/er abducted.   AROM Assessment Site Shoulder   Right/Left Shoulder Left;Right   Right Shoulder Flexion 175 Degrees  same as previous   Right Shoulder ABduction 175 Degrees  same as previous   Right Shoulder Internal Rotation 90 Degrees  70 previous   Right Shoulder External Rotation 70 Degrees  90 previous   Left Shoulder Flexion 139 Degrees  112 previous   Left Shoulder ABduction 117 Degrees  112 previous   Left Shoulder Internal Rotation 90 Degrees  60  previous   Left Shoulder External Rotation 32 Degrees  same as previous   Cervical Flexion 30  40 previous   Cervical Extension 40  same as previous   Cervical - Right Side Bend 35  WFL   Cervical - Left Side Bend 35  WFL   Cervical - Right Rotation 80  55 Previous   Cervical - Left Rotation 90  40 previous   PROM   Overall PROM Comments Assessed supine. IR/er abducted. Right UE WFL in all ranges.    PROM Assessment Site Shoulder   Right/Left Shoulder Right;Left   Left Shoulder Flexion 154 Degrees  133 previous   Left Shoulder ABduction 136 Degrees  118 previous   Left Shoulder Internal Rotation 90 Degrees  45 previous   Left Shoulder External Rotation 40 Degrees  10 previous   Strength   Strength Assessment Site Shoulder   Right/Left Shoulder Right;Left   Right Shoulder Flexion 5/5  same as previous   Right Shoulder ABduction 5/5  same as previous   Right Shoulder Internal Rotation 5/5  same as previous   Right Shoulder External Rotation 5/5  same as previous   Left Shoulder Flexion 4-/5  same as previous   Left Shoulder ABduction 4-/5  3/5 previous   Left Shoulder Internal Rotation 3+/5  3/5 previous   Left  Shoulder External Rotation 3+/5  3/5 previous                  OT Treatments/Exercises (OP) - 12/28/15 1606    Exercises   Exercises Neck;Shoulder   Shoulder Exercises: Supine   Protraction PROM;5 reps   Horizontal ABduction PROM;5 reps   External Rotation PROM;5 reps   Internal Rotation PROM;5 reps   Flexion PROM;5 reps   ABduction PROM;5 reps   Shoulder Exercises: Standing   Protraction AROM;10 reps   Horizontal ABduction AROM;10 reps   External Rotation AROM;10 reps   Internal Rotation AROM;10 reps   Flexion AROM;10 reps   ABduction AROM;10 reps   Extension Theraband;15 reps   Theraband Level (Shoulder Extension) Level 3 (Green)   Row [employer] reps   Theraband Level (Shoulder Row) Level 3 (Green)   Retraction Theraband;15  reps   Theraband Level (Shoulder Retraction) Level 3 (Green)   Shoulder Exercises: ROM/Strengthening   UBE (Upper Arm Bike) level 2 3' forward and 3' reverse   Wall Pushups 10 reps  countertop   Manual Therapy   Manual Therapy Myofascial release;Muscle Energy Technique   Manual therapy comments Manual therapy completed prior to exercises.   Myofascial Release Myofascial release to bilateral upper arms, trapezius, and scapularis regions.    Muscle Energy Technique Muscle energy technique to left anterior and medial deltoid to decrease muscle spasm and increase joint ROM.                 OT Short Term Goals - 11/30/15 1710    OT SHORT TERM GOAL #1   Title Patient will be educated and independent with HEP to increase functional use of bilateral shoulders during daily tasks.    Time 4   Period Weeks   Status On-going   OT SHORT TERM GOAL #2   Title Patient will increase cervical ROM to [facility] to increase ability to look left and right when driving and scanning for traffic.    Time 4   Period Weeks   Status On-going   OT SHORT TERM GOAL #3   Title Patient will increase LUE P/ROM to [facility] to increase ability to complete tasks above shoulder level.   Time 4   Period Weeks   Status On-going   OT SHORT TERM GOAL #4   Title Patient will increase left shoulder strength to 4-/5 to increase ability to complete heavy lifting or pulling tasks at work.    Time 4   Period Weeks   Status On-going   OT SHORT TERM GOAL #5   Title Patient will decrease Bilateral shoulder pain level during completing daily and work tasks to 3/10 or less.    Time 4   Period Weeks   Status On-going   OT SHORT TERM GOAL #6   Title Patient will decrease fascial restrictions to mod amount in bilateral shoulders to increase functional mobility during reaching tasks.    Time 4   Period Weeks   Status On-going           OT Long Term Goals - 11/30/15 1710    OT LONG TERM GOAL #1   Title patient will return  to highest level of independence with all daily and work related tasks using bilateral UE.   Time 8   Period Weeks   Status On-going   OT LONG TERM GOAL #2   Title Patient will increase LUE strength to 4+/5 to increase ability to complete work related tasks with less  difficulty.    Time 8   Period Weeks   Status On-going   OT LONG TERM GOAL #3   Title Patient will decrease pain level in bilateral shoulders to 2/10 or less when completing daily and work related tasks.   Time 8   Period Weeks   Status On-going   OT LONG TERM GOAL #4   Title Patient will decrease fascial restrictions in BUE to trace amount to increase functional mobility during daily tasks.    Time 8   Period Weeks   Status On-going   OT LONG TERM GOAL #5   Title Patient will increase LUE A/ROM to [facility] to increase ability to complete tasks overhead with less difficulty.    Time 8   Period Weeks   Status On-going               Plan - 12/28/15 1650    Clinical Impression Statement A: Mini-reassessment completed this session, pt is progressing towards goals and has improved his LUE range of motion.  Added countertop push-ups, increased scapular theraband repetitions to 15. Pt reports his right arm was very painful over the weekend, however he used it repetitively on Friday at work.    Plan P: Add x to v arms as able to complete. Continue to work on increasing P/ROM and A/ROM of LUE to [facility] to increase ability to use the arm functionally at work.        Problem List There are no active problems to display for this patient.   Ezra Sites, OTR/L  [phone]  12/28/2015, 4:57 PM  [place] [facility] [address] [place], [place], [address] Phone: [phone]   Fax:  [phone]  Name: Gary Cole MRN: [id number] Date of Birth: Feb 04, 1956

## 2016-01-02 ENCOUNTER — Encounter (HOSPITAL_COMMUNITY): Payer: Self-pay

## 2016-01-02 ENCOUNTER — Ambulatory Visit (HOSPITAL_COMMUNITY): Payer: PRIVATE HEALTH INSURANCE

## 2016-01-02 DIAGNOSIS — M25612 Stiffness of left shoulder, not elsewhere classified: Secondary | ICD-10-CM | POA: Diagnosis not present

## 2016-01-02 DIAGNOSIS — M629 Disorder of muscle, unspecified: Secondary | ICD-10-CM

## 2016-01-02 DIAGNOSIS — M6289 Other specified disorders of muscle: Secondary | ICD-10-CM

## 2016-01-02 DIAGNOSIS — M25511 Pain in right shoulder: Secondary | ICD-10-CM

## 2016-01-02 DIAGNOSIS — M25512 Pain in left shoulder: Secondary | ICD-10-CM

## 2016-01-02 DIAGNOSIS — R29898 Other symptoms and signs involving the musculoskeletal system: Secondary | ICD-10-CM

## 2016-01-02 NOTE — Therapy (Signed)
[facility] [address] [place], [place], [address] Phone: [phone]   Fax:  [phone]  Occupational Therapy Treatment  Patient Details  Name: Gary Cole MRN: [id number] Date of Birth: 06-26-1956 Referring Provider: Margarita Rana  Encounter Date: 01/02/2016      OT End of Session - 01/02/16 1636    Visit Number 8   Number of Visits 16   Date for OT Re-Evaluation 01/28/16   Authorization Type [employer] no visit limit   OT Start Time 1550   OT Stop Time 1635   OT Time Calculation (min) 45 min   Activity Tolerance Patient tolerated treatment well   Behavior During Therapy [facility] for tasks assessed/performed      History reviewed. No pertinent past medical history.  No past surgical history on file.  There were no vitals filed for this visit.  Visit Diagnosis:  Shoulder stiffness, left  Shoulder weakness  Tight fascia  Pain of both shoulder joints      Subjective Assessment - 01/02/16 1614    Subjective  A: I fell on the ice and my right shoulder has been hurting since.    Currently in Pain? Yes   Pain Score 1    Pain Location Shoulder   Pain Orientation Left   Pain Descriptors / Indicators Aching   Pain Type Chronic pain   Multiple Pain Sites Yes   Pain Score 2   Pain Location Shoulder   Pain Orientation Right   Pain Descriptors / Indicators Aching   Pain Type Acute pain            [facility] OT Assessment - 01/02/16 1615    Assessment   Diagnosis Bilateral shoulder bursitis and adhesive capsulitis   Precautions   Precautions None                  OT Treatments/Exercises (OP) - 01/02/16 1616    Exercises   Exercises Neck;Shoulder   Shoulder Exercises: Supine   Protraction PROM;5 reps;Strengthening;12 reps   Protraction Weight (lbs) 1   Horizontal ABduction PROM;5 reps;Strengthening;12 reps   Horizontal ABduction Weight (lbs) 1   External Rotation PROM;5  reps;Strengthening;12 reps   External Rotation Weight (lbs) 1   Internal Rotation PROM;5 reps;Strengthening;12 reps   Internal Rotation Weight (lbs) 1   Flexion PROM;5 reps;Strengthening;12 reps   Shoulder Flexion Weight (lbs) 1   ABduction PROM;5 reps;Strengthening;12 reps   Shoulder ABduction Weight (lbs) 1   Shoulder Exercises: Standing   Protraction AROM;12 reps   Horizontal ABduction AROM;12 reps   External Rotation AROM;12 reps   Internal Rotation AROM;12 reps   Flexion AROM;12 reps   ABduction AROM;12 reps   Extension Theraband;15 reps   Theraband Level (Shoulder Extension) Level 3 (Green)   Row [employer] reps   Theraband Level (Shoulder Row) Level 3 (Green)   Retraction Theraband;15 reps   Theraband Level (Shoulder Retraction) Level 3 (Green)   Shoulder Exercises: ROM/Strengthening   UBE (Upper Arm Bike) Level 2 3' reverse  focusing on scapular retraction   Wall Pushups 15 reps  countertop   Proximal Shoulder Strengthening, Supine 12X with 1# no rest breaks   Proximal Shoulder Strengthening, Seated 15X no rest breaks   Shoulder Exercises: Stretch   Internal Rotation Stretch 3 reps  10 seconds with towel going horizontal   Table Stretch - Flexion 3 reps;10 seconds   Other Shoulder Stretches Sidelying sleeper stretch; internal and external rotation; 10 second hold;  3 times.   Manual Therapy   Manual Therapy Myofascial release;Muscle Energy Technique   Manual therapy comments Manual therapy completed prior to exercises.   Myofascial Release Myofascial release to bilateral upper arms, trapezius, and scapularis regions.    Muscle Energy Technique Muscle energy technique to left anterior and medial deltoid to decrease muscle spasm and increase joint ROM.                  OT Short Term Goals - 11/30/15 1710    OT SHORT TERM GOAL #1   Title Patient will be educated and independent with HEP to increase functional use of bilateral shoulders during daily tasks.     Time 4   Period Weeks   Status On-going   OT SHORT TERM GOAL #2   Title Patient will increase cervical ROM to [facility] to increase ability to look left and right when driving and scanning for traffic.    Time 4   Period Weeks   Status On-going   OT SHORT TERM GOAL #3   Title Patient will increase LUE P/ROM to [facility] to increase ability to complete tasks above shoulder level.   Time 4   Period Weeks   Status On-going   OT SHORT TERM GOAL #4   Title Patient will increase left shoulder strength to 4-/5 to increase ability to complete heavy lifting or pulling tasks at work.    Time 4   Period Weeks   Status On-going   OT SHORT TERM GOAL #5   Title Patient will decrease Bilateral shoulder pain level during completing daily and work tasks to 3/10 or less.    Time 4   Period Weeks   Status On-going   OT SHORT TERM GOAL #6   Title Patient will decrease fascial restrictions to mod amount in bilateral shoulders to increase functional mobility during reaching tasks.    Time 4   Period Weeks   Status On-going           OT Long Term Goals - 11/30/15 1710    OT LONG TERM GOAL #1   Title patient will return to highest level of independence with all daily and work related tasks using bilateral UE.   Time 8   Period Weeks   Status On-going   OT LONG TERM GOAL #2   Title Patient will increase LUE strength to 4+/5 to increase ability to complete work related tasks with less difficulty.    Time 8   Period Weeks   Status On-going   OT LONG TERM GOAL #3   Title Patient will decrease pain level in bilateral shoulders to 2/10 or less when completing daily and work related tasks.   Time 8   Period Weeks   Status On-going   OT LONG TERM GOAL #4   Title Patient will decrease fascial restrictions in BUE to trace amount to increase functional mobility during daily tasks.    Time 8   Period Weeks   Status On-going   OT LONG TERM GOAL #5   Title Patient will increase LUE A/ROM to [facility] to increase  ability to complete tasks overhead with less difficulty.    Time 8   Period Weeks   Status On-going               Plan - 01/02/16 1636    Clinical Impression Statement A: Added X to V arms and focused on shoulder stretching this session. Patient required Min VC for form and  technique. Increased trigger points in right shoulder scapular region which may be due to shoveling this weekend.    Plan P: Add 1# weight to standing if able to tolerate. Complete hughston exercises and scapular raises prone.        Problem List There are no active problems to display for this patient.   [name], OTR/L,CBIS  [phone]  01/02/2016, 4:39 PM  [facility] [address] [place], [place], [address] Phone: [phone]   Fax:  [phone]  Name: Gary Cole MRN: [id number] Date of Birth: 1956-09-28

## 2016-01-04 ENCOUNTER — Ambulatory Visit (HOSPITAL_COMMUNITY): Payer: PRIVATE HEALTH INSURANCE | Admitting: Specialist

## 2016-01-04 ENCOUNTER — Telehealth (HOSPITAL_COMMUNITY): Payer: Self-pay | Admitting: Specialist

## 2016-01-04 NOTE — Telephone Encounter (Signed)
He has to work over today and can not come in. NF 01/04/16

## 2016-01-09 ENCOUNTER — Ambulatory Visit (HOSPITAL_COMMUNITY): Payer: PRIVATE HEALTH INSURANCE

## 2016-01-09 DIAGNOSIS — M25511 Pain in right shoulder: Secondary | ICD-10-CM

## 2016-01-09 DIAGNOSIS — M25612 Stiffness of left shoulder, not elsewhere classified: Secondary | ICD-10-CM

## 2016-01-09 DIAGNOSIS — R29898 Other symptoms and signs involving the musculoskeletal system: Secondary | ICD-10-CM

## 2016-01-09 DIAGNOSIS — M629 Disorder of muscle, unspecified: Secondary | ICD-10-CM

## 2016-01-09 DIAGNOSIS — M6289 Other specified disorders of muscle: Secondary | ICD-10-CM

## 2016-01-09 DIAGNOSIS — M25512 Pain in left shoulder: Secondary | ICD-10-CM

## 2016-01-09 NOTE — Therapy (Signed)
[facility] [address] [place], [place], [address] Phone: [phone]   Fax:  [phone]  Occupational Therapy Treatment  Patient Details  Name: Gary Cole MRN: [id number] Date of Birth: 1956-04-06 Referring Provider: Margarita Rana  Encounter Date: 01/09/2016      OT End of Session - 01/09/16 1636    Visit Number 9   Number of Visits 16   Date for OT Re-Evaluation 01/28/16   Authorization Type [employer] no visit limit   OT Start Time 1555   OT Stop Time 1637   OT Time Calculation (min) 42 min   Activity Tolerance Patient tolerated treatment well   Behavior During Therapy [facility] for tasks assessed/performed      No past medical history on file.  No past surgical history on file.  There were no vitals filed for this visit.  Visit Diagnosis:  Shoulder stiffness, left  Shoulder weakness  Tight fascia  Pain of both shoulder joints      Subjective Assessment - 01/09/16 1622    Subjective  A: I got a shot in my right shoulder last week and it has been feeling really good.    Currently in Pain? Yes   Pain Score 1    Pain Location Shoulder   Pain Orientation Left   Pain Descriptors / Indicators Aching   Pain Type Chronic pain   Multiple Pain Sites Yes   Pain Score 1   Pain Location Shoulder   Pain Orientation Right   Pain Descriptors / Indicators Aching   Pain Type Acute pain                      OT Treatments/Exercises (OP) - 01/09/16 0001    Exercises   Exercises Neck;Shoulder   Shoulder Exercises: Supine   Protraction PROM;5 reps;Strengthening;15 reps   Protraction Weight (lbs) 1   Horizontal ABduction PROM;5 reps;Strengthening;15 reps   Horizontal ABduction Weight (lbs) 1   External Rotation PROM;5 reps;Strengthening;15 reps   External Rotation Weight (lbs) 1   Internal Rotation PROM;5 reps;Strengthening;15 reps   Internal Rotation Weight (lbs) 1   Flexion PROM;5  reps;Strengthening;15 reps   Shoulder Flexion Weight (lbs) 1   ABduction PROM;5 reps;Strengthening;15 reps   Shoulder ABduction Weight (lbs) 1   Shoulder Exercises: Prone   Other Prone Exercises Hughston exercises; 10X;    Other Prone Exercises Scapular raises ; 10X   Shoulder Exercises: Standing   Protraction Strengthening;10 reps   Protraction Weight (lbs) 1   Horizontal ABduction Strengthening;10 reps   Horizontal ABduction Weight (lbs) 1   External Rotation Strengthening;10 reps   External Rotation Weight (lbs) 1   Internal Rotation Strengthening;10 reps   Internal Rotation Weight (lbs) 1   Flexion Strengthening;10 reps   Shoulder Flexion Weight (lbs) 1   ABduction Strengthening;10 reps   Shoulder ABduction Weight (lbs) 1   Shoulder Exercises: ROM/Strengthening   UBE (Upper Arm Bike) Level 2 3' reverse  focus on scapular retraction   X to V Arms 10X with 1#   Proximal Shoulder Strengthening, Supine 15X with 1# no rest breaks   Shoulder Exercises: Stretch   Internal Rotation Stretch 3 reps  15 seconds using PVC vertical   Manual Therapy   Manual Therapy Myofascial release;Muscle Energy Technique   Manual therapy comments Manual therapy completed prior to exercises.   Myofascial Release Myofascial release to bilateral upper arms, trapezius, and scapularis regions.    Muscle Energy  Technique Muscle energy technique to left anterior and medial deltoid to decrease muscle spasm and increase joint ROM.                  OT Short Term Goals - 11/30/15 1710    OT SHORT TERM GOAL #1   Title Patient will be educated and independent with HEP to increase functional use of bilateral shoulders during daily tasks.    Time 4   Period Weeks   Status On-going   OT SHORT TERM GOAL #2   Title Patient will increase cervical ROM to [facility] to increase ability to look left and right when driving and scanning for traffic.    Time 4   Period Weeks   Status On-going   OT SHORT TERM GOAL  #3   Title Patient will increase LUE P/ROM to [facility] to increase ability to complete tasks above shoulder level.   Time 4   Period Weeks   Status On-going   OT SHORT TERM GOAL #4   Title Patient will increase left shoulder strength to 4-/5 to increase ability to complete heavy lifting or pulling tasks at work.    Time 4   Period Weeks   Status On-going   OT SHORT TERM GOAL #5   Title Patient will decrease Bilateral shoulder pain level during completing daily and work tasks to 3/10 or less.    Time 4   Period Weeks   Status On-going   OT SHORT TERM GOAL #6   Title Patient will decrease fascial restrictions to mod amount in bilateral shoulders to increase functional mobility during reaching tasks.    Time 4   Period Weeks   Status On-going           OT Long Term Goals - 11/30/15 1710    OT LONG TERM GOAL #1   Title patient will return to highest level of independence with all daily and work related tasks using bilateral UE.   Time 8   Period Weeks   Status On-going   OT LONG TERM GOAL #2   Title Patient will increase LUE strength to 4+/5 to increase ability to complete work related tasks with less difficulty.    Time 8   Period Weeks   Status On-going   OT LONG TERM GOAL #3   Title Patient will decrease pain level in bilateral shoulders to 2/10 or less when completing daily and work related tasks.   Time 8   Period Weeks   Status On-going   OT LONG TERM GOAL #4   Title Patient will decrease fascial restrictions in BUE to trace amount to increase functional mobility during daily tasks.    Time 8   Period Weeks   Status On-going   OT LONG TERM GOAL #5   Title Patient will increase LUE A/ROM to [facility] to increase ability to complete tasks overhead with less difficulty.    Time 8   Period Weeks   Status On-going               Plan - 01/09/16 1635    Clinical Impression Statement A: Patient awesome results during manual stretching today with popping heard during  shoulder flexion with increased ROM. Added prone exercises to work on scapular strength and mobility.    Plan P: Add ball on the wall.         Problem List There are no active problems to display for this patient.   [name], OTR/L,CBIS  [phone]  01/09/2016, 4:56 PM  [facility] [address] [place], [place], [address] Phone: [phone]   Fax:  [phone]  Name: DANTWAN HOSBACH MRN: [id number] Date of Birth: Jun 18, 1956

## 2016-01-11 ENCOUNTER — Encounter (HOSPITAL_COMMUNITY): Payer: Self-pay

## 2016-01-11 ENCOUNTER — Ambulatory Visit (HOSPITAL_COMMUNITY): Payer: PRIVATE HEALTH INSURANCE

## 2016-01-11 DIAGNOSIS — M25512 Pain in left shoulder: Secondary | ICD-10-CM

## 2016-01-11 DIAGNOSIS — M25612 Stiffness of left shoulder, not elsewhere classified: Secondary | ICD-10-CM

## 2016-01-11 DIAGNOSIS — M6289 Other specified disorders of muscle: Secondary | ICD-10-CM

## 2016-01-11 DIAGNOSIS — R29898 Other symptoms and signs involving the musculoskeletal system: Secondary | ICD-10-CM

## 2016-01-11 DIAGNOSIS — M629 Disorder of muscle, unspecified: Secondary | ICD-10-CM

## 2016-01-11 NOTE — Therapy (Signed)
[facility] [address] [place], [place], [address] Phone: [phone]   Fax:  [phone]  Occupational Therapy Treatment  Patient Details  Name: Gary Cole MRN: [id number] Date of Birth: 26-Sep-1956 Referring Provider: Margarita Rana  Encounter Date: 01/11/2016      OT End of Session - 01/11/16 1750    Visit Number 10   Number of Visits 16   Date for OT Re-Evaluation 01/28/16   Authorization Type [employer] no visit limit   OT Start Time 1640   OT Stop Time 1725   OT Time Calculation (min) 45 min   Activity Tolerance Patient tolerated treatment well   Behavior During Therapy [employer] for tasks assessed/performed      History reviewed. No pertinent past medical history.  No past surgical history on file.  There were no vitals filed for this visit.  Visit Diagnosis:  Shoulder weakness  Shoulder stiffness, left  Tight fascia  Pain in joint, shoulder region, left      Subjective Assessment - 01/11/16 1708    Subjective  S: My right arm doesn't hurt at all today.    Currently in Pain? Yes   Pain Score 1    Pain Location Shoulder   Pain Orientation Left   Pain Descriptors / Indicators Aching   Pain Type Chronic pain            [facility] OT Assessment - 01/11/16 1710    Assessment   Diagnosis Bilateral shoulder bursitis and adhesive capsulitis   Precautions   Precautions None                  OT Treatments/Exercises (OP) - 01/11/16 1711    Exercises   Exercises Shoulder   Shoulder Exercises: Supine   Protraction PROM;5 reps;Strengthening;10 reps   Protraction Weight (lbs) 2   Horizontal ABduction PROM;5 reps;Strengthening;10 reps   Horizontal ABduction Weight (lbs) 2   External Rotation PROM;5 reps;Strengthening;10 reps   External Rotation Weight (lbs) 2   Internal Rotation PROM;5 reps;Strengthening;10 reps   Internal Rotation Weight (lbs) 2   Flexion PROM;5  reps;Strengthening;10 reps   Shoulder Flexion Weight (lbs) 2   ABduction PROM;5 reps;Strengthening;10 reps   Shoulder ABduction Weight (lbs) 2   Shoulder Exercises: Prone   Other Prone Exercises Hugh[employer] exercises; 10X;    Other Prone Exercises Scapular raises ; 10X   Shoulder Exercises: Standing   Extension Theraband;15 reps   Theraband Level (Shoulder Extension) Level 3 (Green)   Row [employer] reps   Theraband Level (Shoulder Row) Level 3 (Green)   Retraction Theraband;15 reps   Theraband Level (Shoulder Retraction) Level 3 (Green)   Shoulder Exercises: ROM/Strengthening   Proximal Shoulder Strengthening, Supine 15X with 1# no rest breaks   Ball on Wall 1' flexion 1' abduction each arm with green ball   Manual Therapy   Manual Therapy Myofascial release;Muscle Energy Technique   Manual therapy comments Manual therapy completed prior to exercises.   Myofascial Release Myofascial release to bilateral upper arms, trapezius, and scapularis regions.    Muscle Energy Technique Muscle energy technique to left anterior and medial deltoid to decrease muscle spasm and increase joint ROM.                OT Education - 01/11/16 1749    Education provided Yes   Education Details Scapular theraband strengthening exercises   Person(s) Educated Patient   Methods Explanation;Demonstration;Verbal cues;Handout   Comprehension Verbalized understanding;Returned demonstration  OT Short Term Goals - 11/30/15 1710    OT SHORT TERM GOAL #1   Title Patient will be educated and independent with HEP to increase functional use of bilateral shoulders during daily tasks.    Time 4   Period Weeks   Status On-going   OT SHORT TERM GOAL #2   Title Patient will increase cervical ROM to [facility] to increase ability to look left and right when driving and scanning for traffic.    Time 4   Period Weeks   Status On-going   OT SHORT TERM GOAL #3   Title Patient will increase LUE P/ROM to [facility]  to increase ability to complete tasks above shoulder level.   Time 4   Period Weeks   Status On-going   OT SHORT TERM GOAL #4   Title Patient will increase left shoulder strength to 4-/5 to increase ability to complete heavy lifting or pulling tasks at work.    Time 4   Period Weeks   Status On-going   OT SHORT TERM GOAL #5   Title Patient will decrease Bilateral shoulder pain level during completing daily and work tasks to 3/10 or less.    Time 4   Period Weeks   Status On-going   OT SHORT TERM GOAL #6   Title Patient will decrease fascial restrictions to mod amount in bilateral shoulders to increase functional mobility during reaching tasks.    Time 4   Period Weeks   Status On-going           OT Long Term Goals - 11/30/15 1710    OT LONG TERM GOAL #1   Title patient will return to highest level of independence with all daily and work related tasks using bilateral UE.   Time 8   Period Weeks   Status On-going   OT LONG TERM GOAL #2   Title Patient will increase LUE strength to 4+/5 to increase ability to complete work related tasks with less difficulty.    Time 8   Period Weeks   Status On-going   OT LONG TERM GOAL #3   Title Patient will decrease pain level in bilateral shoulders to 2/10 or less when completing daily and work related tasks.   Time 8   Period Weeks   Status On-going   OT LONG TERM GOAL #4   Title Patient will decrease fascial restrictions in BUE to trace amount to increase functional mobility during daily tasks.    Time 8   Period Weeks   Status On-going   OT LONG TERM GOAL #5   Title Patient will increase LUE A/ROM to [facility] to increase ability to complete tasks overhead with less difficulty.    Time 8   Period Weeks   Status On-going               Plan - 01/11/16 1751    Clinical Impression Statement A: Pt continues to have great results with manual stretching as we heard more popping noise and his shoulder was almost to 180 degrees.  patient given an updated HEP.    Plan P: Add additional appts to last week of january. Add [employer] row and press.         Problem List There are no active problems to display for this patient.   Gary Cole, OTR/L,CBIS  [phone]  01/11/2016, 5:59 PM  [facility] [address] [place], [place], [address] Phone: [phone]   Fax:  [phone]  Name: Gary Cole MRN: [id number] Date of Birth: 1956-12-10

## 2016-01-11 NOTE — Patient Instructions (Signed)

## 2016-01-16 ENCOUNTER — Encounter (HOSPITAL_COMMUNITY): Payer: Self-pay

## 2016-01-16 ENCOUNTER — Ambulatory Visit (HOSPITAL_COMMUNITY): Payer: PRIVATE HEALTH INSURANCE

## 2016-01-16 DIAGNOSIS — M25612 Stiffness of left shoulder, not elsewhere classified: Secondary | ICD-10-CM

## 2016-01-16 DIAGNOSIS — R29898 Other symptoms and signs involving the musculoskeletal system: Secondary | ICD-10-CM

## 2016-01-16 DIAGNOSIS — M6289 Other specified disorders of muscle: Secondary | ICD-10-CM

## 2016-01-16 DIAGNOSIS — M629 Disorder of muscle, unspecified: Secondary | ICD-10-CM

## 2016-01-16 NOTE — Therapy (Signed)
[facility] [address] [place], [place], [address] Phone: [phone]   Fax:  [phone]  Occupational Therapy Treatment  Patient Details  Name: Gary Cole MRN: [id number] Date of Birth: 06/21/1956 Referring Provider: Margarita Rana  Encounter Date: 01/16/2016      OT End of Session - 01/16/16 1644    Visit Number 11   Number of Visits 16   Date for OT Re-Evaluation 01/28/16   Authorization Type [employer] no visit limit   OT Start Time 1605   OT Stop Time 1645   OT Time Calculation (min) 40 min   Activity Tolerance Patient tolerated treatment well   Behavior During Therapy [facility] for tasks assessed/performed      History reviewed. No pertinent past medical history.  No past surgical history on file.  There were no vitals filed for this visit.  Visit Diagnosis:  Shoulder stiffness, left  Shoulder weakness  Tight fascia                    OT Treatments/Exercises (OP) - 01/16/16 1627    Exercises   Exercises Shoulder   Shoulder Exercises: Supine   Protraction PROM;5 reps;Strengthening;15 reps   Protraction Weight (lbs) 2   Horizontal ABduction PROM;5 reps;Strengthening;15 reps   Horizontal ABduction Weight (lbs) 2   External Rotation PROM;5 reps;Strengthening;12 reps   External Rotation Weight (lbs) 2   Internal Rotation PROM;5 reps;Strengthening;15 reps   Internal Rotation Weight (lbs) 2   Flexion PROM;5 reps;Strengthening;15 reps   Shoulder Flexion Weight (lbs) 2   ABduction PROM;5 reps;Strengthening;15 reps   Shoulder ABduction Weight (lbs) 2   Shoulder Exercises: Prone   Other Prone Exercises Hughston exercises; 12X;    Shoulder Exercises: Standing   Protraction Strengthening;15 reps   Protraction Weight (lbs) 2   Horizontal ABduction Strengthening;15 reps   Horizontal ABduction Weight (lbs) 2   External Rotation Strengthening;15 reps   External Rotation Weight (lbs) 2    Internal Rotation Strengthening;15 reps   Internal Rotation Weight (lbs) 2   Flexion Strengthening;15 reps   Shoulder Flexion Weight (lbs) 2   ABduction Strengthening;15 reps   Shoulder ABduction Weight (lbs) 2   Shoulder Exercises: ROM/Strengthening   Cybex Press 2 plate;15 reps   Cybex Row 2 plate;15 reps   Proximal Shoulder Strengthening, Supine 15X with 2# no rest breaks   Ball on Wall 1' flexion 1' abduction each arm with green ball   Manual Therapy   Manual Therapy Myofascial release;Muscle Energy Technique   Manual therapy comments Manual therapy completed prior to exercises.   Myofascial Release Myofascial release to bilateral upper arms, trapezius, and scapularis regions.    Muscle Energy Technique Muscle energy technique to left anterior and medial deltoid to decrease muscle spasm and increase joint ROM.                  OT Short Term Goals - 11/30/15 1710    OT SHORT TERM GOAL #1   Title Patient will be educated and independent with HEP to increase functional use of bilateral shoulders during daily tasks.    Time 4   Period Weeks   Status On-going   OT SHORT TERM GOAL #2   Title Patient will increase cervical ROM to [facility] to increase ability to look left and right when driving and scanning for traffic.    Time 4   Period Weeks   Status On-going   OT SHORT TERM  GOAL #3   Title Patient will increase LUE P/ROM to [facility] to increase ability to complete tasks above shoulder level.   Time 4   Period Weeks   Status On-going   OT SHORT TERM GOAL #4   Title Patient will increase left shoulder strength to 4-/5 to increase ability to complete heavy lifting or pulling tasks at work.    Time 4   Period Weeks   Status On-going   OT SHORT TERM GOAL #5   Title Patient will decrease Bilateral shoulder pain level during completing daily and work tasks to 3/10 or less.    Time 4   Period Weeks   Status On-going   OT SHORT TERM GOAL #6   Title Patient will decrease  fascial restrictions to mod amount in bilateral shoulders to increase functional mobility during reaching tasks.    Time 4   Period Weeks   Status On-going           OT Long Term Goals - 11/30/15 1710    OT LONG TERM GOAL #1   Title patient will return to highest level of independence with all daily and work related tasks using bilateral UE.   Time 8   Period Weeks   Status On-going   OT LONG TERM GOAL #2   Title Patient will increase LUE strength to 4+/5 to increase ability to complete work related tasks with less difficulty.    Time 8   Period Weeks   Status On-going   OT LONG TERM GOAL #3   Title Patient will decrease pain level in bilateral shoulders to 2/10 or less when completing daily and work related tasks.   Time 8   Period Weeks   Status On-going   OT LONG TERM GOAL #4   Title Patient will decrease fascial restrictions in BUE to trace amount to increase functional mobility during daily tasks.    Time 8   Period Weeks   Status On-going   OT LONG TERM GOAL #5   Title Patient will increase LUE A/ROM to [facility] to increase ability to complete tasks overhead with less difficulty.    Time 8   Period Weeks   Status On-going               Plan - 01/16/16 1644    Clinical Impression Statement A: Added cybex row and press, increased strengthening repetitions supine and standing to 15 as well as completed both with 2#. Minimal VC for form and technique. Overall, patient did very well.    Plan P: Continue to work on increasing passive ROM needed during functional reaching task.         Problem List There are no active problems to display for this patient.   [name], OTR/L,CBIS  [phone]  01/16/2016, 4:54 PM   [facility] [address] [place], [place], [address] Phone: [phone]   Fax:  [phone]  Name: Gary Cole MRN: [id number] Date of Birth: 12-Oct-1956

## 2016-01-18 ENCOUNTER — Ambulatory Visit (HOSPITAL_COMMUNITY): Payer: PRIVATE HEALTH INSURANCE

## 2016-01-23 ENCOUNTER — Ambulatory Visit (HOSPITAL_COMMUNITY): Payer: PRIVATE HEALTH INSURANCE | Admitting: Occupational Therapy

## 2016-01-23 ENCOUNTER — Encounter (HOSPITAL_COMMUNITY): Payer: Self-pay | Admitting: Occupational Therapy

## 2016-01-23 DIAGNOSIS — M25511 Pain in right shoulder: Secondary | ICD-10-CM

## 2016-01-23 DIAGNOSIS — M25512 Pain in left shoulder: Secondary | ICD-10-CM

## 2016-01-23 DIAGNOSIS — M6289 Other specified disorders of muscle: Secondary | ICD-10-CM

## 2016-01-23 DIAGNOSIS — R29898 Other symptoms and signs involving the musculoskeletal system: Secondary | ICD-10-CM

## 2016-01-23 DIAGNOSIS — M25612 Stiffness of left shoulder, not elsewhere classified: Secondary | ICD-10-CM

## 2016-01-23 DIAGNOSIS — M629 Disorder of muscle, unspecified: Secondary | ICD-10-CM

## 2016-01-23 NOTE — Therapy (Signed)
[facility] [address] [place], [place], [address] Phone: [phone]   Fax:  [phone]  Occupational Therapy Treatment  Patient Details  Name: Gary Cole MRN: [id number] Date of Birth: 05/14/1956 Referring Provider: Margarita Rana  Encounter Date: 01/23/2016      OT End of Session - 01/23/16 1650    Visit Number 12   Number of Visits 16   Date for OT Re-Evaluation 01/28/16   Authorization Type [employer] no visit limit   OT Start Time 1600   OT Stop Time 1647   OT Time Calculation (min) 47 min   Activity Tolerance Patient tolerated treatment well   Behavior During Therapy [facility] for tasks assessed/performed      History reviewed. No pertinent past medical history.  No past surgical history on file.  There were no vitals filed for this visit.  Visit Diagnosis:  Shoulder stiffness, left  Shoulder weakness  Tight fascia  Pain of both shoulder joints      Subjective Assessment - 01/23/16 1600    Subjective  S: I've had a rough few days.    Currently in Pain? Yes   Pain Score 4    Pain Location Shoulder   Pain Orientation Right;Left   Pain Descriptors / Indicators Aching   Pain Type Chronic pain   Pain Radiating Towards not radiating   Pain Onset More than a month ago   Pain Frequency Intermittent   Aggravating Factors  repetitive use   Pain Relieving Factors pain medications, heat/ice   Effect of Pain on Daily Activities limited ability to complete work tasks            [facility] OT Assessment - 01/23/16 1559    Assessment   Diagnosis Bilateral shoulder bursitis and adhesive capsulitis   Precautions   Precautions None                  OT Treatments/Exercises (OP) - 01/23/16 1603    Exercises   Exercises Shoulder   Shoulder Exercises: Supine   Protraction PROM;5 reps;Strengthening;15 reps   Protraction Weight (lbs) 2   Horizontal ABduction PROM;5 reps;Strengthening;15  reps   Horizontal ABduction Weight (lbs) 2   External Rotation PROM;5 reps;Strengthening;12 reps   External Rotation Weight (lbs) 2   Internal Rotation PROM;5 reps;Strengthening;15 reps   Internal Rotation Weight (lbs) 2   Flexion PROM;5 reps;Strengthening;15 reps   Shoulder Flexion Weight (lbs) 2   ABduction PROM;5 reps;Strengthening;15 reps   Shoulder ABduction Weight (lbs) 2   Shoulder Exercises: Prone   Other Prone Exercises Hughston exercises; 12X;    Shoulder Exercises: Standing   Protraction Strengthening;15 reps   Protraction Weight (lbs) 2   Horizontal ABduction Strengthening;15 reps   Horizontal ABduction Weight (lbs) 2   External Rotation Strengthening;15 reps   External Rotation Weight (lbs) 2   Internal Rotation Strengthening;15 reps   Internal Rotation Weight (lbs) 2   Flexion Strengthening;15 reps   Shoulder Flexion Weight (lbs) 2   ABduction Strengthening;15 reps   Shoulder ABduction Weight (lbs) 2   Shoulder Exercises: ROM/Strengthening   Cybex Press 2 plate;15 reps   Cybex Row 2 plate;15 reps   Over Head Lace 2' with 1# wrist weights   X to V Arms 10X with 2# weight   Proximal Shoulder Strengthening, Supine 15X with 2# no rest breaks   Manual Therapy   Manual Therapy Myofascial release;Muscle Energy Technique   Manual therapy comments Manual therapy completed  prior to exercises.   Myofascial Release Myofascial release to bilateral upper arms, trapezius, and scapularis regions.    Muscle Energy Technique Muscle energy technique to left anterior and medial deltoid to decrease muscle spasm and increase joint ROM.                  OT Short Term Goals - 11/30/15 1710    OT SHORT TERM GOAL #1   Title Patient will be educated and independent with HEP to increase functional use of bilateral shoulders during daily tasks.    Time 4   Period Weeks   Status On-going   OT SHORT TERM GOAL #2   Title Patient will increase cervical ROM to [facility] to increase  ability to look left and right when driving and scanning for traffic.    Time 4   Period Weeks   Status On-going   OT SHORT TERM GOAL #3   Title Patient will increase LUE P/ROM to [facility] to increase ability to complete tasks above shoulder level.   Time 4   Period Weeks   Status On-going   OT SHORT TERM GOAL #4   Title Patient will increase left shoulder strength to 4-/5 to increase ability to complete heavy lifting or pulling tasks at work.    Time 4   Period Weeks   Status On-going   OT SHORT TERM GOAL #5   Title Patient will decrease Bilateral shoulder pain level during completing daily and work tasks to 3/10 or less.    Time 4   Period Weeks   Status On-going   OT SHORT TERM GOAL #6   Title Patient will decrease fascial restrictions to mod amount in bilateral shoulders to increase functional mobility during reaching tasks.    Time 4   Period Weeks   Status On-going           OT Long Term Goals - 11/30/15 1710    OT LONG TERM GOAL #1   Title patient will return to highest level of independence with all daily and work related tasks using bilateral UE.   Time 8   Period Weeks   Status On-going   OT LONG TERM GOAL #2   Title Patient will increase LUE strength to 4+/5 to increase ability to complete work related tasks with less difficulty.    Time 8   Period Weeks   Status On-going   OT LONG TERM GOAL #3   Title Patient will decrease pain level in bilateral shoulders to 2/10 or less when completing daily and work related tasks.   Time 8   Period Weeks   Status On-going   OT LONG TERM GOAL #4   Title Patient will decrease fascial restrictions in BUE to trace amount to increase functional mobility during daily tasks.    Time 8   Period Weeks   Status On-going   OT LONG TERM GOAL #5   Title Patient will increase LUE A/ROM to [facility] to increase ability to complete tasks overhead with less difficulty.    Time 8   Period Weeks   Status On-going               Plan  - 01/23/16 1650    Clinical Impression Statement A: Completed strengthening exercises, added 1# wrist weights to overhead lacing, added 2# weights to x to v arms. Pt reports increased pain and soreness since Friday, notably in right anterior shoulder. Pt with good form, verbal cuing for technique at times.  Plan P: Continue working to increase P/ROM for increased ability to complete overhead tasks at work.         Problem List There are no active problems to display for this patient.   Ezra Sites, OTR/L  [phone]  01/23/2016, 4:53 PM  [facility] [address] [place], [place], [address] Phone: [phone]   Fax:  [phone]  Name: Gary Cole MRN: [id number] Date of Birth: [birth year]

## 2016-01-25 ENCOUNTER — Encounter (HOSPITAL_COMMUNITY): Payer: Self-pay | Admitting: Occupational Therapy

## 2016-01-25 ENCOUNTER — Ambulatory Visit (HOSPITAL_COMMUNITY): Payer: PRIVATE HEALTH INSURANCE | Attending: Orthopedic Surgery | Admitting: Occupational Therapy

## 2016-01-25 DIAGNOSIS — M25612 Stiffness of left shoulder, not elsewhere classified: Secondary | ICD-10-CM | POA: Diagnosis not present

## 2016-01-25 DIAGNOSIS — M629 Disorder of muscle, unspecified: Secondary | ICD-10-CM | POA: Insufficient documentation

## 2016-01-25 DIAGNOSIS — M25511 Pain in right shoulder: Secondary | ICD-10-CM | POA: Insufficient documentation

## 2016-01-25 DIAGNOSIS — R29898 Other symptoms and signs involving the musculoskeletal system: Secondary | ICD-10-CM | POA: Insufficient documentation

## 2016-01-25 DIAGNOSIS — M6289 Other specified disorders of muscle: Secondary | ICD-10-CM

## 2016-01-25 DIAGNOSIS — M25512 Pain in left shoulder: Secondary | ICD-10-CM | POA: Diagnosis present

## 2016-01-25 NOTE — Therapy (Signed)
[facility] [place], [place], [address] Phone: [phone]   Fax:  [phone]  Occupational Therapy Reassessment & Treatment  Patient Details  Name: FORNEY KLEINPETER MRN: [id number] Date of Birth: [birth year] Referring Provider: Edmonia Lynch  Encounter Date: 01/25/2016      OT End of Session - 01/25/16 1650    Visit Number 13   Number of Visits 17   Date for OT Re-Evaluation 03/25/16  mini-reassessment 02/22/2016   Authorization Type [employer] no visit limit   OT Start Time 1558   OT Stop Time 1645   OT Time Calculation (min) 47 min   Activity Tolerance Patient tolerated treatment well   Behavior During Therapy [facility] for tasks assessed/performed      History reviewed. No pertinent past medical history.  No past surgical history on file.  There were no vitals filed for this visit.  Visit Diagnosis:  Shoulder stiffness, left  Shoulder weakness  Tight fascia  Pain of both shoulder joints      Subjective Assessment - 01/25/16 1557    Subjective  S: My arm feels better than it did on Tuesday.    Currently in Pain? Yes   Pain Score 2    Pain Location Shoulder   Pain Orientation Right;Left   Pain Descriptors / Indicators Aching   Pain Type Acute pain   Pain Radiating Towards none   Pain Onset More than a month ago   Pain Frequency Intermittent   Aggravating Factors  repetitive use   Pain Relieving Factors pain medication, heat/ice   Effect of Pain on Daily Activities limited ability to complete work tasks   Multiple Pain Sites No           [facility] OT Assessment - 01/25/16 1552    Assessment   Diagnosis Bilateral shoulder bursitis and adhesive capsulitis   Precautions   Precautions None   Palpation   Palpation comment Mod fascial restrictions in bilateral shoulders and cervical region.    AROM   Overall AROM Comments Assessed seated. IR/er abducted.   AROM Assessment Site Shoulder    Right/Left Shoulder Left;Right   Right Shoulder Flexion 180 Degrees  previous 175   Right Shoulder ABduction 175 Degrees  same as previous   Right Shoulder Internal Rotation 90 Degrees  same as previous   Right Shoulder External Rotation 78 Degrees  70 previous   Left Shoulder Flexion 144 Degrees  139 previous   Left Shoulder ABduction 135 Degrees  117 previous   Left Shoulder Internal Rotation 90 Degrees  same as previous   Left Shoulder External Rotation 32 Degrees  same as previous   Cervical Flexion 32  30 previous   Cervical Extension 66  40 previous; WFL   Cervical - Right Rotation 82  80 previous; WFL   Cervical - Left Rotation 86  90 previous; WFL   PROM   Overall PROM Comments Assessed supine. IR/er abducted. Right UE WFL in all ranges.    PROM Assessment Site Shoulder   Right/Left Shoulder Right;Left   Left Shoulder Flexion 160 Degrees  154 previous   Left Shoulder ABduction 148 Degrees  136 previous   Left Shoulder Internal Rotation 90 Degrees  same as previous   Left Shoulder External Rotation 45 Degrees  40 previous   Strength   Overall Strength Comments Assessed seated, ER/IR adducted   Strength Assessment Site Shoulder   Right/Left Shoulder Left   Left Shoulder Flexion  4+/5  4-/5 previous   Left Shoulder ABduction 4/5  4-/5 previous   Left Shoulder Internal Rotation 4-/5  3+/5 previous   Left Shoulder External Rotation 4-/5  3+/5 previous                  OT Treatments/Exercises (OP) - 01/25/16 1600    Exercises   Exercises Shoulder   Shoulder Exercises: Standing   Protraction Strengthening;15 reps   Protraction Weight (lbs) 2   Horizontal ABduction Strengthening;15 reps   Horizontal ABduction Weight (lbs) 2   External Rotation Strengthening;15 reps   External Rotation Weight (lbs) 2   Internal Rotation Strengthening;15 reps   Internal Rotation Weight (lbs) 2   Flexion Strengthening;15 reps   Shoulder Flexion Weight (lbs) 2    ABduction Strengthening;15 reps   Shoulder ABduction Weight (lbs) 2   Shoulder Exercises: ROM/Strengthening   X to V Arms 10X with 2# weight   Ball on Wall 1' flexion 1' abduction each arm with green ball   Manual Therapy   Manual Therapy Myofascial release;Muscle Energy Technique   Manual therapy comments Manual therapy completed prior to exercises.   Myofascial Release Myofascial release to bilateral upper arms, trapezius, and scapularis regions.    Muscle Energy Technique Muscle energy technique to left anterior and medial deltoid to decrease muscle spasm and increase joint ROM.                 OT Short Term Goals - 01/25/16 1650    OT SHORT TERM GOAL #1   Title Patient will be educated and independent with HEP to increase functional use of bilateral shoulders during daily tasks.    Time 4   Period Weeks   Status On-going   OT SHORT TERM GOAL #2   Title Patient will increase cervical ROM to [facility] to increase ability to look left and right when driving and scanning for traffic.    Time 4   Period Weeks   Status Partially Met   OT SHORT TERM GOAL #3   Title Patient will increase LUE P/ROM to [facility] to increase ability to complete tasks above shoulder level.   Time 4   Period Weeks   Status Partially Met   OT SHORT TERM GOAL #4   Title Patient will increase left shoulder strength to 4-/5 to increase ability to complete heavy lifting or pulling tasks at work.    Time 4   Period Weeks   Status Achieved   OT SHORT TERM GOAL #5   Title Patient will decrease Bilateral shoulder pain level during completing daily and work tasks to 3/10 or less.    Time 4   Period Weeks   Status On-going   OT SHORT TERM GOAL #6   Title Patient will decrease fascial restrictions to mod amount in bilateral shoulders to increase functional mobility during reaching tasks.    Time 4   Period Weeks   Status Achieved           OT Long Term Goals - 01/25/16 1651    OT LONG TERM GOAL #1    Title patient will return to highest level of independence with all daily and work related tasks using bilateral UE.   Time 8   Period Weeks   Status On-going   OT LONG TERM GOAL #2   Title Patient will increase LUE strength to 4+/5 to increase ability to complete work related tasks with less difficulty.    Time 8  Period Weeks   Status Partially Met   OT LONG TERM GOAL #3   Title Patient will decrease pain level in bilateral shoulders to 2/10 or less when completing daily and work related tasks.   Time 8   Period Weeks   Status On-going   OT LONG TERM GOAL #4   Title Patient will decrease fascial restrictions in BUE to trace amount to increase functional mobility during daily tasks.    Time 8   Period Weeks   Status On-going   OT LONG TERM GOAL #5   Title Patient will increase LUE A/ROM to [facility] to increase ability to complete tasks overhead with less difficulty.    Time 8   Period Weeks   Status On-going               Plan - 01/25/16 1651    Clinical Impression Statement A: Reassessment completed this session. Pt has met 2/6 STGs and partially met 2/6 STGs and 1/5 LTGs. Pt has made improvements in A/ROM and strength of LUE, as well as cervical A/ROM. Pt would benefit from continuing therapy 2x/week for 2 additional weeks to continue improving range of motion and strength of BUE.    Plan P: Continue working on aggressive stretching to LUE to increase P/ROM and A/ROM, continue strengthening tasks, working for increased ability to complete overhead tasks at home and work. Next Session: Add standing field goal, resume prone exercises.         Problem List There are no active problems to display for this patient.   Guadelupe Sabin, OTR/L  [phone]  01/25/2016, 4:59 PM  [facility] [address] [place], [place], [address] Phone: [phone]   Fax:  [phone]  Name: KYZEN HORN MRN: [id number] Date of Birth:  Apr 25, 1956

## 2016-01-30 ENCOUNTER — Encounter (HOSPITAL_COMMUNITY): Payer: Self-pay | Admitting: Occupational Therapy

## 2016-01-30 ENCOUNTER — Ambulatory Visit (HOSPITAL_COMMUNITY): Payer: PRIVATE HEALTH INSURANCE | Admitting: Occupational Therapy

## 2016-01-30 DIAGNOSIS — M25612 Stiffness of left shoulder, not elsewhere classified: Secondary | ICD-10-CM | POA: Diagnosis not present

## 2016-01-30 DIAGNOSIS — M629 Disorder of muscle, unspecified: Secondary | ICD-10-CM

## 2016-01-30 DIAGNOSIS — R29898 Other symptoms and signs involving the musculoskeletal system: Secondary | ICD-10-CM

## 2016-01-30 DIAGNOSIS — M25512 Pain in left shoulder: Secondary | ICD-10-CM

## 2016-01-30 DIAGNOSIS — M6289 Other specified disorders of muscle: Secondary | ICD-10-CM

## 2016-01-30 DIAGNOSIS — M25511 Pain in right shoulder: Secondary | ICD-10-CM

## 2016-01-30 NOTE — Therapy (Signed)
[place], [place], [address] Phone: [phone]   Fax:  [phone]  Occupational Therapy Treatment  Patient Details  Name: Gary Cole MRN: [id number] Date of Birth: [birth year] Referring Provider: Edmonia Lynch  Encounter Date: 01/30/2016      OT End of Session - 01/30/16 1648    Visit Number 14   Number of Visits 17   Date for OT Re-Evaluation 03/25/16  mini-reassessment 02/22/2016   Authorization Type [employer] no visit limit   OT Start Time 1600   OT Stop Time 1646   OT Time Calculation (min) 46 min   Activity Tolerance Patient tolerated treatment well   Behavior During Therapy [facility] for tasks assessed/performed      History reviewed. No pertinent past medical history.  No past surgical history on file.  There were no vitals filed for this visit.  Visit Diagnosis:  Shoulder stiffness, left  Shoulder weakness  Tight fascia  Pain of both shoulder joints      Subjective Assessment - 01/30/16 1559    Subjective  S: My neck has been giving me a fit today.    Currently in Pain? Yes   Pain Score 1    Pain Location Shoulder   Pain Orientation Right;Left   Pain Descriptors / Indicators Aching   Pain Type Acute pain   Pain Radiating Towards none   Pain Onset More than a month ago   Pain Frequency Intermittent   Aggravating Factors  repetitive use   Pain Relieving Factors pain meds, heat/ice   Effect of Pain on Daily Activities limited ability to complete work tasks            [facility] OT Assessment - 01/30/16 1559    Assessment   Diagnosis Bilateral shoulder bursitis and adhesive capsulitis   Precautions   Precautions None                  OT Treatments/Exercises (OP) - 01/30/16 1602    Exercises   Exercises Shoulder   Shoulder Exercises: Supine   Protraction PROM;5 reps;Strengthening;15 reps   Protraction Weight (lbs) 2   Horizontal ABduction PROM;5  reps;Strengthening;15 reps   Horizontal ABduction Weight (lbs) 2   External Rotation PROM;5 reps;Strengthening;15 reps   External Rotation Weight (lbs) 2   Internal Rotation PROM;5 reps;Strengthening;15 reps   Internal Rotation Weight (lbs) 2   Flexion PROM;5 reps;Strengthening;15 reps   Shoulder Flexion Weight (lbs) 2   ABduction PROM;5 reps;Strengthening;15 reps   Shoulder ABduction Weight (lbs) 2   Shoulder Exercises: Prone   Other Prone Exercises Resume next session if able to tolerate   Shoulder Exercises: Standing   Protraction Strengthening;15 reps   Protraction Weight (lbs) 2   Horizontal ABduction Strengthening;15 reps   Horizontal ABduction Weight (lbs) 2   External Rotation Strengthening;15 reps   External Rotation Weight (lbs) 2   Internal Rotation Strengthening;15 reps   Internal Rotation Weight (lbs) 2   Flexion Strengthening;15 reps   Shoulder Flexion Weight (lbs) 2   ABduction Strengthening;15 reps   Shoulder ABduction Weight (lbs) 2   Other Standing Exercises standing field goal 10X   Shoulder Exercises: ROM/Strengthening   UBE (Upper Arm Bike) Level 2 3' reverse  with focus on scapular retraction   Cybex Press 2 plate;15 reps   Cybex Row 2 plate;15 reps   X to V Arms 10X with 2# weight   Shoulder Exercises: Stretch   Wall Stretch -  Flexion 3 reps;10 seconds   Manual Therapy   Manual Therapy Myofascial release;Muscle Energy Technique   Manual therapy comments Manual therapy completed prior to exercises.   Myofascial Release Myofascial release to bilateral upper arms, trapezius, and scapularis regions.    Muscle Energy Technique Muscle energy technique to left anterior and medial deltoid to decrease muscle spasm and increase joint ROM.                  OT Short Term Goals - 01/25/16 1650    OT SHORT TERM GOAL #1   Title Patient will be educated and independent with HEP to increase functional use of bilateral shoulders during daily tasks.    Time  4   Period Weeks   Status On-going   OT SHORT TERM GOAL #2   Title Patient will increase cervical ROM to [facility] to increase ability to look left and right when driving and scanning for traffic.    Time 4   Period Weeks   Status Partially Met   OT SHORT TERM GOAL #3   Title Patient will increase LUE P/ROM to [facility] to increase ability to complete tasks above shoulder level.   Time 4   Period Weeks   Status Partially Met   OT SHORT TERM GOAL #4   Title Patient will increase left shoulder strength to 4-/5 to increase ability to complete heavy lifting or pulling tasks at work.    Time 4   Period Weeks   Status Achieved   OT SHORT TERM GOAL #5   Title Patient will decrease Bilateral shoulder pain level during completing daily and work tasks to 3/10 or less.    Time 4   Period Weeks   Status On-going   OT SHORT TERM GOAL #6   Title Patient will decrease fascial restrictions to mod amount in bilateral shoulders to increase functional mobility during reaching tasks.    Time 4   Period Weeks   Status Achieved           OT Long Term Goals - 01/25/16 1651    OT LONG TERM GOAL #1   Title patient will return to highest level of independence with all daily and work related tasks using bilateral UE.   Time 8   Period Weeks   Status On-going   OT LONG TERM GOAL #2   Title Patient will increase LUE strength to 4+/5 to increase ability to complete work related tasks with less difficulty.    Time 8   Period Weeks   Status Partially Met   OT LONG TERM GOAL #3   Title Patient will decrease pain level in bilateral shoulders to 2/10 or less when completing daily and work related tasks.   Time 8   Period Weeks   Status On-going   OT LONG TERM GOAL #4   Title Patient will decrease fascial restrictions in BUE to trace amount to increase functional mobility during daily tasks.    Time 8   Period Weeks   Status On-going   OT LONG TERM GOAL #5   Title Patient will increase LUE A/ROM to [facility] to  increase ability to complete tasks overhead with less difficulty.    Time 8   Period Weeks   Status On-going               Plan - 01/30/16 1648    Clinical Impression Statement A: Continued aggressive stretching during P/ROM, continued strengthening this session. Added standing field goal, pt  unable to complete prone exercises due to migraine headache and neck pain. Added wall stretch in flexion, pt with good form.    Plan P: Continue wall stretch, resume prone exercises if able to tolerate. Provide flexion stretch HEP.         Problem List There are no active problems to display for this patient.  Guadelupe Sabin, OTR/L  [phone]  01/30/2016, 4:50 PM  [place] [address] [place], [place], [address] Phone: [phone]   Fax:  [phone]  Name: Gary Cole MRN: [id number] Date of Birth: Dec 03, 1956

## 2016-02-01 ENCOUNTER — Encounter (HOSPITAL_COMMUNITY): Payer: Self-pay | Admitting: Occupational Therapy

## 2016-02-01 ENCOUNTER — Ambulatory Visit (HOSPITAL_COMMUNITY): Payer: PRIVATE HEALTH INSURANCE | Admitting: Occupational Therapy

## 2016-02-01 DIAGNOSIS — M629 Disorder of muscle, unspecified: Secondary | ICD-10-CM

## 2016-02-01 DIAGNOSIS — M25512 Pain in left shoulder: Secondary | ICD-10-CM

## 2016-02-01 DIAGNOSIS — M6289 Other specified disorders of muscle: Secondary | ICD-10-CM

## 2016-02-01 DIAGNOSIS — M25511 Pain in right shoulder: Secondary | ICD-10-CM

## 2016-02-01 DIAGNOSIS — M25612 Stiffness of left shoulder, not elsewhere classified: Secondary | ICD-10-CM

## 2016-02-01 DIAGNOSIS — R29898 Other symptoms and signs involving the musculoskeletal system: Secondary | ICD-10-CM

## 2016-02-01 NOTE — Patient Instructions (Signed)
  1) Flexion Wall Stretch    Face wall, place affected handon wall in front of you. Slide hand up the wall  and lean body in towards the wall. Hold for 10 seconds. Repeat 3 times. 1-2 times/day.   2) Abduction Wall Stretch    Standing parallel to wall,  place affected hand on wall beside you. Slide hand up the wall  and lean body in towards the wall. Hold for 10 seconds. Repeat 3 times. 1-2 times/day.   3) Posterior Capsule Stretch    Bring the involved arm across chest. Grasp elbow and pull toward chest until you feel a stretch in the back of the upper arm and shoulder. Hold 10 seconds. Repeat 3X. Complete 1-2 times/day.     4) External Rotation Stretch:     Place your affected hand on the wall with the elbow bent and gently turn your body the opposite direction until a stretch is felt. Hold 10 seconds repeat 3 times.

## 2016-02-01 NOTE — Therapy (Signed)
[place], [place], [address] Phone: [phone]   Fax:  [phone]  Occupational Therapy Treatment  Patient Details  Name: Gary Cole MRN: [id number] Date of Birth: 03/20/1956 Referring Provider: Edmonia Lynch  Encounter Date: 02/01/2016      OT End of Session - 02/01/16 1703    Visit Number 15   Number of Visits 17   Date for OT Re-Evaluation 03/25/16  mini-reassessment 02/22/2016   Authorization Type [employer] no visit limit   OT Start Time 1603   OT Stop Time 1646   OT Time Calculation (min) 43 min   Activity Tolerance Patient tolerated treatment well   Behavior During Therapy [facility] for tasks assessed/performed      History reviewed. No pertinent past medical history.  No past surgical history on file.  There were no vitals filed for this visit.  Visit Diagnosis:  Shoulder stiffness, left  Shoulder weakness  Tight fascia  Pain of both shoulder joints      Subjective Assessment - 02/01/16 1605    Subjective  S: My neck is still bothering me some.    Currently in Pain? Yes   Pain Score 1    Pain Location Shoulder   Pain Orientation Right;Left   Pain Descriptors / Indicators Aching   Pain Type Acute pain   Pain Radiating Towards none   Pain Onset More than a month ago   Pain Frequency Intermittent   Aggravating Factors  repetitive use   Pain Relieving Factors pain meds, heat/ice   Effect of Pain on Daily Activities limited ability to complete work tasks   Multiple Pain Sites No            [facility] OT Assessment - 02/01/16 1604    Assessment   Diagnosis Bilateral shoulder bursitis and adhesive capsulitis   Precautions   Precautions None                  OT Treatments/Exercises (OP) - 02/01/16 1606    Exercises   Exercises Shoulder   Shoulder Exercises: Standing   Protraction Strengthening;15 reps   Protraction Weight (lbs) 2   Horizontal  ABduction Strengthening;15 reps   Horizontal ABduction Weight (lbs) 2   External Rotation Strengthening;15 reps   External Rotation Weight (lbs) 2   Internal Rotation Strengthening;15 reps   Internal Rotation Weight (lbs) 2   Flexion Strengthening;15 reps   Shoulder Flexion Weight (lbs) 2   ABduction Strengthening;15 reps   Shoulder ABduction Weight (lbs) 2   Other Standing Exercises standing field goal 10X   Shoulder Exercises: ROM/Strengthening   UBE (Upper Arm Bike) Level 3 2' forward 2' reverse  cuing to remain at 8.0-10.0   Cybex Press 2.5 plate;15 reps   Cybex Row 2.5 plate;15 reps   "W" Arms 10X    X to V Arms 15X with 2# weight   Shoulder Exercises: Stretch   External Rotation Stretch 3 reps;10 seconds  doorway   Wall Stretch - Flexion 3 reps;10 seconds   Wall Stretch - ABduction 3 reps;10 seconds   Manual Therapy   Manual Therapy Myofascial release;Muscle Energy Technique   Manual therapy comments Manual therapy completed prior to exercises.   Myofascial Release Myofascial release to bilateral upper arms, trapezius, and scapularis regions.    Muscle Energy Technique Muscle energy technique to left anterior and medial deltoid to decrease muscle spasm and increase joint ROM.  OT Education - 02/01/16 1643    Education provided Yes   Education Details shoulder stretches-flexion, abduction, ER, and posterior capsule stretch   Person(s) Educated Patient   Methods Explanation;Demonstration;Handout   Comprehension Verbalized understanding;Returned demonstration          OT Short Term Goals - 01/25/16 1650    OT SHORT TERM GOAL #1   Title Patient will be educated and independent with HEP to increase functional use of bilateral shoulders during daily tasks.    Time 4   Period Weeks   Status On-going   OT SHORT TERM GOAL #2   Title Patient will increase cervical ROM to [facility] to increase ability to look left and right when driving and scanning for  traffic.    Time 4   Period Weeks   Status Partially Met   OT SHORT TERM GOAL #3   Title Patient will increase LUE P/ROM to [facility] to increase ability to complete tasks above shoulder level.   Time 4   Period Weeks   Status Partially Met   OT SHORT TERM GOAL #4   Title Patient will increase left shoulder strength to 4-/5 to increase ability to complete heavy lifting or pulling tasks at work.    Time 4   Period Weeks   Status Achieved   OT SHORT TERM GOAL #5   Title Patient will decrease Bilateral shoulder pain level during completing daily and work tasks to 3/10 or less.    Time 4   Period Weeks   Status On-going   OT SHORT TERM GOAL #6   Title Patient will decrease fascial restrictions to mod amount in bilateral shoulders to increase functional mobility during reaching tasks.    Time 4   Period Weeks   Status Achieved           OT Long Term Goals - 01/25/16 1651    OT LONG TERM GOAL #1   Title patient will return to highest level of independence with all daily and work related tasks using bilateral UE.   Time 8   Period Weeks   Status On-going   OT LONG TERM GOAL #2   Title Patient will increase LUE strength to 4+/5 to increase ability to complete work related tasks with less difficulty.    Time 8   Period Weeks   Status Partially Met   OT LONG TERM GOAL #3   Title Patient will decrease pain level in bilateral shoulders to 2/10 or less when completing daily and work related tasks.   Time 8   Period Weeks   Status On-going   OT LONG TERM GOAL #4   Title Patient will decrease fascial restrictions in BUE to trace amount to increase functional mobility during daily tasks.    Time 8   Period Weeks   Status On-going   OT LONG TERM GOAL #5   Title Patient will increase LUE A/ROM to [facility] to increase ability to complete tasks overhead with less difficulty.    Time 8   Period Weeks   Status On-going               Plan - 02/01/16 1703    Clinical Impression  Statement A: Added wall stretch in abduction, ER doorway stretch. Increased cybex row/press to 2.5# plate. Pt with several "pops" during passive stretching of LUE. Pt unable to complete prone exercises due to continued migraine. Provided stretching HEP.    Plan P: Continue strengthening, aggressive stretching. Follow up on  stretching HEP        Problem List There are no active problems to display for this patient.   Guadelupe Sabin, OTR/L  [phone]  02/01/2016, 5:09 PM  [place] [address] [place], [place], [address] Phone: [phone]   Fax:  [phone]  Name: DIERRE CREVIER MRN: [id number] Date of Birth: Nov 08, 1956

## 2016-02-06 ENCOUNTER — Encounter (HOSPITAL_COMMUNITY): Payer: No Typology Code available for payment source | Admitting: Occupational Therapy

## 2016-02-08 ENCOUNTER — Encounter (HOSPITAL_COMMUNITY): Payer: Self-pay | Admitting: Occupational Therapy

## 2016-02-08 ENCOUNTER — Ambulatory Visit (HOSPITAL_COMMUNITY): Payer: PRIVATE HEALTH INSURANCE | Admitting: Occupational Therapy

## 2016-02-08 DIAGNOSIS — R29898 Other symptoms and signs involving the musculoskeletal system: Secondary | ICD-10-CM

## 2016-02-08 DIAGNOSIS — M25512 Pain in left shoulder: Secondary | ICD-10-CM

## 2016-02-08 DIAGNOSIS — M25612 Stiffness of left shoulder, not elsewhere classified: Secondary | ICD-10-CM | POA: Diagnosis not present

## 2016-02-08 DIAGNOSIS — M25511 Pain in right shoulder: Secondary | ICD-10-CM

## 2016-02-08 DIAGNOSIS — M6289 Other specified disorders of muscle: Secondary | ICD-10-CM

## 2016-02-08 DIAGNOSIS — M629 Disorder of muscle, unspecified: Secondary | ICD-10-CM

## 2016-02-08 NOTE — Therapy (Signed)
[place], [place], [address] Phone: [phone]   Fax:  [phone]  Occupational Therapy Reassessment, Treatment, Discharge Summary  Patient Details  Name: Gary Cole MRN: [id number] Date of Birth: [birth year] Referring Provider: Edmonia Lynch  Encounter Date: 02/08/2016      OT End of Session - 02/08/16 1723    Visit Number 16   Number of Visits 17   Date for OT Re-Evaluation 03/25/16  mini-reassessment 02/22/2016   Authorization Type [employer] no visit limit   OT Start Time 1603   OT Stop Time 1648   OT Time Calculation (min) 45 min   Activity Tolerance Patient tolerated treatment well   Behavior During Therapy [facility] for tasks assessed/performed      History reviewed. No pertinent past medical history.  No past surgical history on file.  There were no vitals filed for this visit.  Visit Diagnosis:  Shoulder stiffness, left  Shoulder weakness  Tight fascia  Pain of both shoulder joints      Subjective Assessment - 02/08/16 1604    Subjective  S: My left shoulder doesn't hurt anymore, now it's my right.    Currently in Pain? Yes   Pain Score 3    Pain Location Shoulder   Pain Orientation Right   Pain Descriptors / Indicators Aching   Pain Type Acute pain   Pain Radiating Towards none   Pain Onset More than a month ago   Pain Frequency Intermittent   Aggravating Factors  repetitive use   Pain Relieving Factors pain meds, ice   Effect of Pain on Daily Activities limited ability to complete work tasks   Multiple Pain Sites No           [facility] OT Assessment - 02/08/16 1626    Assessment   Diagnosis Bilateral shoulder bursitis and adhesive capsulitis   Precautions   Precautions None   AROM   Overall AROM Comments Assessed seated. IR/er abducted.   AROM Assessment Site Shoulder   Right/Left Shoulder Right;Left   Right Shoulder Flexion 180 Degrees  same as  previous   Right Shoulder ABduction 180 Degrees  175 previous   Right Shoulder Internal Rotation 90 Degrees  same as previous   Right Shoulder External Rotation 80 Degrees  78 previous   Left Shoulder Flexion 151 Degrees  144 previous   Left Shoulder ABduction 145 Degrees  135 previous   Left Shoulder Internal Rotation 90 Degrees  same as previous   Left Shoulder External Rotation 43 Degrees  32 previous   Cervical Flexion 38  32 previous   Cervical Extension 68  66 previous   Cervical - Right Rotation 80  82 previous   Cervical - Left Rotation 84  84 previous   PROM   Overall PROM Comments Assessed supine. IR/er abducted. Right UE WFL in all ranges.    PROM Assessment Site Shoulder   Right/Left Shoulder Right;Left   Left Shoulder Flexion 155 Degrees  160 previous   Left Shoulder ABduction 160 Degrees  148 previous   Left Shoulder Internal Rotation 90 Degrees  same as previous   Left Shoulder External Rotation 49 Degrees  45 previous   Strength   Overall Strength Comments Assessed seated, ER/IR adducted   Strength Assessment Site Shoulder   Right/Left Shoulder Left   Left Shoulder Flexion 5/5  4+/5 previous   Left Shoulder ABduction 4+/5  4/5 previous   Left Shoulder Internal Rotation  4/5  4-/5 previous   Left Shoulder External Rotation 4/5  4-/5 previous                  OT Treatments/Exercises (OP) - 02/08/16 1721    Exercises   Exercises Shoulder   Shoulder Exercises: Supine   Protraction PROM;5 reps   Horizontal ABduction PROM;5 reps   External Rotation PROM;5 reps   Internal Rotation PROM;5 reps   Flexion PROM;5 reps   ABduction PROM;5 reps   Shoulder Exercises: Standing   Other Standing Exercises Green theraband strengthening exercises: horizontal ab/adduction, ER/IR, PNF pattern exercises, 10X each   Manual Therapy   Manual Therapy Myofascial release;Muscle Energy Technique   Manual therapy comments Manual therapy completed prior to  exercises.   Myofascial Release Myofascial release to bilateral upper arms, trapezius, and scapularis regions.    Muscle Energy Technique Muscle energy technique to left anterior and medial deltoid to decrease muscle spasm and increase joint ROM.               OT Education - 02/08/16 1722    Education provided Yes   Education Details green theraband strengthening exercises, information on TENS unit for pain management   Person(s) Educated Patient   Methods Explanation;Demonstration;Handout   Comprehension Verbalized understanding;Returned demonstration          OT Short Term Goals - 02/08/16 1723    OT SHORT TERM GOAL #1   Title Patient will be educated and independent with HEP to increase functional use of bilateral shoulders during daily tasks.    Time 4   Period Weeks   Status Achieved   OT SHORT TERM GOAL #2   Title Patient will increase cervical ROM to [facility] to increase ability to look left and right when driving and scanning for traffic.    Time 4   Period Weeks   Status Partially Met   OT SHORT TERM GOAL #3   Title Patient will increase LUE P/ROM to [facility] to increase ability to complete tasks above shoulder level.   Time 4   Period Weeks   Status Achieved   OT SHORT TERM GOAL #4   Title Patient will increase left shoulder strength to 4-/5 to increase ability to complete heavy lifting or pulling tasks at work.    Time 4   Period Weeks   Status Achieved   OT SHORT TERM GOAL #5   Title Patient will decrease Bilateral shoulder pain level during completing daily and work tasks to 3/10 or less.    Time 4   Period Weeks   Status Partially Met   OT SHORT TERM GOAL #6   Title Patient will decrease fascial restrictions to mod amount in bilateral shoulders to increase functional mobility during reaching tasks.    Time 4   Period Weeks   Status Achieved           OT Long Term Goals - 02/08/16 1724    OT LONG TERM GOAL #1   Title patient will return to highest  level of independence with all daily and work related tasks using bilateral UE.   Time 8   Period Weeks   Status Partially Met   OT LONG TERM GOAL #2   Title Patient will increase LUE strength to 4+/5 to increase ability to complete work related tasks with less difficulty.    Time 8   Period Weeks   Status Partially Met   OT LONG TERM GOAL #3   Title Patient  will decrease pain level in bilateral shoulders to 2/10 or less when completing daily and work related tasks.   Time 8   Period Weeks   Status Not Met   OT LONG TERM GOAL #4   Title Patient will decrease fascial restrictions in BUE to trace amount to increase functional mobility during daily tasks.    Time 8   Period Weeks   Status Not Met   OT LONG TERM GOAL #5   Title Patient will increase LUE A/ROM to [facility] to increase ability to complete tasks overhead with less difficulty.    Time 8   Period Weeks   Status Achieved               Plan - 02/08/16 1723    Clinical Impression Statement A: Reassessment completed this session, pt has met 3/5 STGs and partially met 2/5 STGs, has met 1/5 LTGs and partially met 2/5 LTGs. Pt reports he is pleased with current functional level, as he has increased his ability to complete overhead tasks and is able to lift more with the LUE than previously able. Pt provided wtih greent theraband strengthening HEP and is agreeable to discharge today.    Plan P: Discharge pt        Problem List There are no active problems to display for this patient.   Guadelupe Sabin, OTR/L  [phone]  02/08/2016, 5:28 PM  [place], [place], [address] Phone: [phone]   Fax:  [phone]  Name: Gary Cole MRN: [id number] Date of Birth: 10/07/1956   OCCUPATIONAL THERAPY DISCHARGE SUMMARY  Visits from Start of Care: 16  Current functional level related to goals / functional outcomes: See above. Pt reports increased  ability to use LUE during work and daily tasks, LUE pain has decreased and pt is now able to sleep on the left side.    Remaining deficits: Pt continues to have deficits in ROM of the LUE, and is currently experiencing pain in the RUE and cervical regions. Pt continues to have difficulty when lifting heavy boxes at work (70+ pounds)   Education / Equipment: Pt provided with information on a TENS unit for pain management at home, as well as green theraband strengthening exercises.  Plan: Patient agrees to discharge.  Patient goals were partially met. Patient is being discharged due to being pleased with the current functional level.  ?????

## 2016-02-08 NOTE — Patient Instructions (Signed)
Strengthening: Chest Pull - Resisted   Hold Theraband in front of body with hands about shoulder width a part. Pull band a part and back together slowly. Repeat _10-15___ times. Complete __1__ set(s) per session.. Repeat __1-2__ session(s) per day.  http://orth.exer.us/926   Copyright  VHI. All rights reserved.   PNF Strengthening: Resisted   Standing with resistive band around each hand, bring right arm up and away, thumb back. Repeat __10-15__ times per set. Do _1___ sets per session. Do _1-2___ sessions per day.  http://orth.exer.us/918   Copyright  VHI. All rights reserved.   PNF Strengthening: Resisted   Standing with resistive band around each hand, bring right arm up and across body. Repeat __10-15__ times per set. Do __1__ sets per session. Do _1-2___ sessions per day.  http://orth.exer.us/920   Copyright  VHI. All rights reserved.    Resisted External Rotation: in Neutral - Bilateral   Sit or stand, tubing in both hands, elbows at sides, bent to 90, forearms forward. Pinch shoulder blades together and rotate forearms out. Keep elbows at sides. Repeat _10-15___ times per set. Do __1__ sets per session. Do _1-2___ sessions per day.  http://orth.exer.us/966   Copyright  VHI. All rights reserved.   PNF Strengthening: Resisted   Standing, hold resistive band above head. Bring right arm down and out from side. Repeat __10-15__ times per set. Do __1__ sets per session. Do _1-2___ sessions per day.  http://orth.exer.us/922   Copyright  VHI. All rights reserved.   

## 2016-02-26 ENCOUNTER — Encounter (HOSPITAL_COMMUNITY): Payer: Self-pay

## 2016-03-27 NOTE — H&P (Signed)
  NTS SOAP Note  Vital Signs:  Vitals as of: 03/26/2016: Systolic 154: Diastolic 90: Heart Rate 74: Temp 28F (Temporal): Height 49ft 11in: Weight 174Lbs 0 Ounces: BMI 24.27   BMI : 24.27 kg/m2  Subjective: This 60 year old male presents for of need for screening TCS.  Last had a colonoscopy many years ago.  Denies family h/o colon carcinoma.  Denies any significant lower gi complaints.  Review of Symptoms:  Constitutional:unremarkable   Head:unremarkable Eyes:unremarkable   sinus problems Cardiovascular:  unremarkable Respiratory:unremarkable Gastrointestinheartburn, dyspepsia Genitourinary:frequency joint, neck, and back pain Skin:unremarkable Hematolgic/Lymphatic:unremarkable   Allergic/Immunologic:unremarkable   Past Medical History:  Reviewed  Past Medical History  Surgical History: cholecystectomy, knee surgery Medical Problems: HTN Allergies: nkda Medications: tamsulosin, sumatriptan   Social History:Reviewed  Social History  Preferred Language: English Race:  White Ethnicity: Not Hispanic / Latino Age: 56 year Marital Status:  S Alcohol: no   Smoking Status: Never smoker reviewed on 03/26/2016 Functional Status reviewed on 03/26/2016 ------------------------------------------------ Bathing: Normal Cooking: Normal Dressing: Normal Driving: Normal Eating: Normal Managing Meds: Normal Oral Care: Normal Shopping: Normal Toileting: Normal Transferring: Normal Walking: Normal Cognitive Status reviewed on 03/26/2016 ------------------------------------------------ Attention: Normal Decision Making: Normal Language: Normal Memory: Normal Motor: Normal Perception: Normal Problem Solving: Normal Visual and Spatial: Normal   Family History:Reviewed  Family Health History Mother  Father, Deceased; Kidney or renal cancer;     Objective Information: General:Well appearing, well nourished in no distress. Heart:RRR, no  murmur Lungs:  CTA bilaterally, no wheezes, rhonchi, rales.  Breathing unlabored. Abdomen:Soft, NT/ND, no HSM, no masses. deferred to procedure  Assessment:Need for screening TCS  Diagnoses: V76.51  Z12.11 Screening for malignant neoplasm of colon (Encounter for screening for malignant neoplasm of colon)  Procedures: 95284 - OFFICE OUTPATIENT NEW 20 MINUTES    Plan:  Scheduled for screening TCS on 04/12/16.   Patient Education:Alternative treatments to surgery were discussed with patient (and family).  Risks and benefits  of procedure including bleeding and perforation were fully explained to the patient (and family) who gave informed consent. Patient/family questions were addressed.  Follow-up:Pending Surgery

## 2016-04-12 ENCOUNTER — Encounter (HOSPITAL_COMMUNITY): Payer: Self-pay | Admitting: *Deleted

## 2016-04-12 ENCOUNTER — Encounter (HOSPITAL_COMMUNITY)
Admission: RE | Disposition: A | Discharge status: Home / Self Care | Payer: Self-pay | Source: Ambulatory Visit | Attending: General Surgery

## 2016-04-12 ENCOUNTER — Ambulatory Visit (HOSPITAL_COMMUNITY)
Admission: RE | Admit: 2016-04-12 | Discharge: 2016-04-12 | Disposition: A | Discharge status: Home / Self Care | Payer: No Typology Code available for payment source | Source: Ambulatory Visit | Attending: General Surgery | Admitting: General Surgery

## 2016-04-12 DIAGNOSIS — I10 Essential (primary) hypertension: Secondary | ICD-10-CM | POA: Diagnosis not present

## 2016-04-12 DIAGNOSIS — Z8051 Family history of malignant neoplasm of kidney: Secondary | ICD-10-CM | POA: Insufficient documentation

## 2016-04-12 DIAGNOSIS — Z79899 Other long term (current) drug therapy: Secondary | ICD-10-CM | POA: Diagnosis not present

## 2016-04-12 DIAGNOSIS — Z1211 Encounter for screening for malignant neoplasm of colon: Secondary | ICD-10-CM | POA: Insufficient documentation

## 2016-04-12 HISTORY — DX: Headache: R51

## 2016-04-12 HISTORY — DX: Headache, unspecified: R51.9

## 2016-04-12 HISTORY — PX: COLONOSCOPY: SHX5424

## 2016-04-12 SURGERY — COLONOSCOPY
Anesthesia: Moderate Sedation

## 2016-04-12 MED ORDER — SODIUM CHLORIDE 0.9 % IV SOLN
INTRAVENOUS | Status: DC
  Administered 2016-04-12: 1000 mL via INTRAVENOUS

## 2016-04-12 MED ORDER — STERILE WATER FOR IRRIGATION IR SOLN
Status: DC | PRN
  Administered 2016-04-12: 07:00:00

## 2016-04-12 MED ORDER — MEPERIDINE HCL 50 MG/ML IJ SOLN
INTRAMUSCULAR | Status: DC | PRN
  Administered 2016-04-12: 50 mg via INTRAVENOUS

## 2016-04-12 MED ORDER — MIDAZOLAM HCL 5 MG/5ML IJ SOLN
INTRAMUSCULAR | Status: AC
  Filled 2016-04-12: qty 5

## 2016-04-12 MED ORDER — MEPERIDINE HCL 50 MG/ML IJ SOLN
INTRAMUSCULAR | Status: AC
  Filled 2016-04-12: qty 1

## 2016-04-12 MED ORDER — MIDAZOLAM HCL 5 MG/5ML IJ SOLN
INTRAMUSCULAR | Status: DC | PRN
  Administered 2016-04-12: 1 mg via INTRAVENOUS
  Administered 2016-04-12: 3 mg via INTRAVENOUS

## 2016-04-12 MED ORDER — SIMETHICONE 40 MG/0.6ML PO SUSP
ORAL | Status: AC
  Filled 2016-04-12: qty 30

## 2016-04-12 NOTE — Discharge Instructions (Signed)
Colonoscopy, Care After °Refer to this sheet in the next few weeks. These instructions provide you with information on caring for yourself after your procedure. Your health care provider may also give you more specific instructions. Your treatment has been planned according to current medical practices, but problems sometimes occur. Call your health care provider if you have any problems or questions after your procedure. °WHAT TO EXPECT AFTER THE PROCEDURE  °After your procedure, it is typical to have the following: °· A small amount of blood in your stool. °· Moderate amounts of gas and mild abdominal cramping or bloating. °HOME CARE INSTRUCTIONS °· Do not drive, operate machinery, or sign important documents for 24 hours. °· You may shower and resume your regular physical activities, but move at a slower pace for the first 24 hours. °· Take frequent rest periods for the first 24 hours. °· Walk around or put a warm pack on your abdomen to help reduce abdominal cramping and bloating. °· Drink enough fluids to keep your urine clear or pale yellow. °· You may resume your normal diet as instructed by your health care provider. Avoid heavy or fried foods that are hard to digest. °· Avoid drinking alcohol for 24 hours or as instructed by your health care provider. °· Only take over-the-counter or prescription medicines as directed by your health care provider. °· If a tissue sample (biopsy) was taken during your procedure: °¨ Do not take aspirin or blood thinners for 7 days, or as instructed by your health care provider. °¨ Do not drink alcohol for 7 days, or as instructed by your health care provider. °¨ Eat soft foods for the first 24 hours. °SEEK MEDICAL CARE IF: °You have persistent spotting of blood in your stool 2-3 days after the procedure. °SEEK IMMEDIATE MEDICAL CARE IF: °· You have more than a small spotting of blood in your stool. °· You pass large blood clots in your stool. °· Your abdomen is swollen  (distended). °· You have nausea or vomiting. °· You have a fever. °· You have increasing abdominal pain that is not relieved with medicine. °  °This information is not intended to replace advice given to you by your health care provider. Make sure you discuss any questions you have with your health care provider. °  °Document Released: 07/23/2004 Document Revised: 09/29/2013 Document Reviewed: 08/16/2013 °Elsevier Interactive Patient Education ©2016 [employer] ° °

## 2016-04-12 NOTE — Interval H&P Note (Signed)
History and Physical Interval Note:  04/12/2016 7:25 AM  Gary Cole  has presented today for surgery, with the diagnosis of screening  The various methods of treatment have been discussed with the patient and family. After consideration of risks, benefits and other options for treatment, the patient has consented to  Procedure(s): COLONOSCOPY (N/A) as a surgical intervention .  The patient's history has been reviewed, patient examined, no change in status, stable for surgery.  I have reviewed the patient's chart and labs.  Questions were answered to the patient's satisfaction.     Franky Macho A

## 2016-04-12 NOTE — Op Note (Signed)
[facility] Patient Name: Gary Cole Procedure Date: 04/12/2016 7:11 AM MRN: [id number] Date of Birth: 06-09-1956 Attending MD: Franky Macho , MD CSN: [id number] Age: 60 Admit Type: Outpatient Procedure:                Colonoscopy Indications:              Screening for colorectal malignant neoplasm Providers:                Franky Macho, MD, Brain Hilts, RN, Marya Landry, Technologist Referring MD:              Medicines:                Midazolam 5 mg IV, Meperidine 50 mg IV Complications:            No immediate complications. Estimated blood loss:                            None. Estimated Blood Loss:     Estimated blood loss: none. Procedure:                Pre-Anesthesia Assessment:                           - Prior to the procedure, a History and Physical                            was performed, and patient medications and                            allergies were reviewed. The patient is competent.                            The risks and benefits of the procedure and the                            sedation options and risks were discussed with the                            patient. All questions were answered and informed                            consent was obtained. Patient identification and                            proposed procedure were verified by the nurse in                            the endoscopy suite. Mental Status Examination:                            alert and oriented. Airway Examination: Mallampati  Class II (the uvula but not tonsillar pillars                            visualized). Prophylactic Antibiotics: The patient                            does not require prophylactic antibiotics. Prior                            Anticoagulants: The patient has taken no previous                            anticoagulant or antiplatelet agents. ASA Grade                            Assessment: II  - A patient with mild systemic                            disease. After reviewing the risks and benefits,                            the patient was deemed in satisfactory condition to                            undergo the procedure. The anesthesia plan was to                            use moderate sedation / analgesia (conscious                            sedation). Immediately prior to administration of                            medications, the patient was re-assessed for                            adequacy to receive sedatives. The heart rate,                            respiratory rate, oxygen saturations, blood                            pressure, adequacy of pulmonary ventilation, and                            response to care were monitored throughout the                            procedure. The physical status of the patient was                            re-assessed after the procedure.  After obtaining informed consent, the colonoscope                            was passed under direct vision. Throughout the                            procedure, the patient's blood pressure, pulse, and                            oxygen saturations were monitored continuously. The                            EC-[id number] ([id number]) scope was introduced through                            the anus and advanced to the the cecum, identified                            by the appendiceal orifice, ileocecal valve and                            palpation. The entire colon was examined. The                            quality of the bowel preparation was adequate. The                            entire colon was examined. Anatomical landmarks                            were photographed. The quality of the bowel                            preparation was adequate. No anatomical landmarks                            were photographed. Scope In: 7:30:37 AM Scope Out: 7:41:29 AM Total  Procedure Duration: 0 hours 10 minutes 52 seconds  Findings:      The perianal and digital rectal examinations were normal.      The entire examined colon appeared normal. Impression:               - The entire examined colon is normal.                           - No specimens collected.                           - The entire examined colon is normal. Moderate Sedation:      Moderate (conscious) sedation was administered by the endoscopy nurse       and supervised by the endoscopist. The following parameters were       monitored: oxygen saturation, heart rate, blood pressure, respiratory       rate, EKG, adequacy of pulmonary ventilation, and response to care. Recommendation:           -  Written discharge instructions were provided to                            the patient.                           - Resume previous diet.                           - Repeat colonoscopy in 10 years for screening                            purposes.                           - Return to normal activities in 1 day.                           - Continue present medications.                           - Patient has a contact number available for                            emergencies. The signs and symptoms of potential                            delayed complications were discussed with the                            patient. Return to normal activities tomorrow.                            Written discharge instructions were provided to the                            patient.                           - The signs and symptoms of potential delayed                            complications were discussed with the patient. Procedure Code(s):        --- Professional ---                           Z6109, Colorectal cancer screening; colonoscopy on                            individual not meeting criteria for high risk Diagnosis Code(s):        --- Professional ---                           Z12.11, Encounter for  screening for malignant  neoplasm of colon CPT copyright 2016 [employer]. All rights reserved. The codes documented in this report are preliminary and upon coder review may  be revised to meet current compliance requirements. Franky Macho, MD Franky Macho, MD 04/12/2016 7:58:35 AM This report has been signed electronically. Number of Addenda: 0

## 2016-04-16 ENCOUNTER — Encounter (HOSPITAL_COMMUNITY): Payer: Self-pay | Admitting: General Surgery

## 2018-06-03 ENCOUNTER — Ambulatory Visit (HOSPITAL_COMMUNITY)
Admission: RE | Admit: 2018-06-03 | Discharge: 2018-06-03 | Disposition: A | Discharge status: Home / Self Care | Payer: Managed Care, Other (non HMO) | Source: Ambulatory Visit | Attending: Family Medicine | Admitting: Family Medicine

## 2018-06-03 ENCOUNTER — Other Ambulatory Visit (HOSPITAL_COMMUNITY): Payer: Self-pay | Admitting: Family Medicine

## 2018-06-03 DIAGNOSIS — Z9049 Acquired absence of other specified parts of digestive tract: Secondary | ICD-10-CM | POA: Diagnosis not present

## 2018-06-03 DIAGNOSIS — R590 Localized enlarged lymph nodes: Secondary | ICD-10-CM | POA: Insufficient documentation

## 2018-06-03 DIAGNOSIS — K228 Other specified diseases of esophagus: Secondary | ICD-10-CM | POA: Diagnosis not present

## 2018-06-03 DIAGNOSIS — R1031 Right lower quadrant pain: Secondary | ICD-10-CM | POA: Insufficient documentation

## 2018-06-03 LAB — POCT I-STAT CREATININE: Creatinine, Ser: 0.9 mg/dL (ref 0.61–1.24)

## 2018-06-03 MED ORDER — IOPAMIDOL (ISOVUE-300) INJECTION 61%
100.0000 mL | Freq: Once | INTRAVENOUS | Status: AC | PRN
  Administered 2018-06-03: 100 mL via INTRAVENOUS

## 2018-06-16 ENCOUNTER — Encounter: Payer: Self-pay | Admitting: Gastroenterology

## 2018-06-22 ENCOUNTER — Ambulatory Visit: Payer: Managed Care, Other (non HMO) | Admitting: Gastroenterology

## 2018-06-22 ENCOUNTER — Encounter: Payer: Self-pay | Admitting: *Deleted

## 2018-06-22 ENCOUNTER — Other Ambulatory Visit: Payer: Self-pay | Admitting: *Deleted

## 2018-06-22 ENCOUNTER — Encounter: Payer: Self-pay | Admitting: Gastroenterology

## 2018-06-22 VITALS — BP 114/69 | HR 69 | Temp 97.0°F | Ht 71.0 in | Wt 174.4 lb

## 2018-06-22 DIAGNOSIS — R1319 Other dysphagia: Secondary | ICD-10-CM

## 2018-06-22 DIAGNOSIS — R1310 Dysphagia, unspecified: Secondary | ICD-10-CM

## 2018-06-22 DIAGNOSIS — R6881 Early satiety: Secondary | ICD-10-CM | POA: Diagnosis not present

## 2018-06-22 DIAGNOSIS — R197 Diarrhea, unspecified: Secondary | ICD-10-CM | POA: Diagnosis not present

## 2018-06-22 DIAGNOSIS — R933 Abnormal findings on diagnostic imaging of other parts of digestive tract: Secondary | ICD-10-CM | POA: Diagnosis not present

## 2018-06-22 DIAGNOSIS — R1011 Right upper quadrant pain: Secondary | ICD-10-CM

## 2018-06-22 NOTE — Progress Notes (Signed)
Primary Care Physician:  Assunta Found, MD  Primary Gastroenterologist:  Roetta Sessions, MD   Chief Complaint  Patient presents with  . Abdominal Pain  . Gastroesophageal Reflux    HPI:  Gary Cole is a 62 y.o. male here at the request of Dr. Phillips Odor for further evaluation of abnormal esophagus.  Patient went to see Dr. Phillips Odor few weeks back with moderate to severe right-sided abdominal pain.  Pain predominantly in the right lower quadrant at the time.  CT scan was performed to rule out appendicitis.  CT unremarkable except for 13 mm soft tissue lesion abutting and/or arising from the right posterior lateral aspect of the distal esophagus.  Patient states his symptoms began abruptly towards the beginning of June.  He noted low back pain, right-sided abdominal pain with increased frequency of stools, somewhat looser stools.  No fever.  No vomiting.  Back pain has subsided for the most part.  He has multiple other GI issues including frequent heartburn/indigestion, bloating, early satiety.  He has a good appetite but gets full really quickly.  Complains of solid food dysphagia.  At times food becomes lodged and he loses his breath, eventually it passes.  No vomiting.  For the past month or so he wakes up hoarse.  At baseline has noted increased stool frequency since his gallbladder was removed in 2008.  Sometimes may have 4-5 stools a day, some days 0-1.  No constipation.  No melena or rectal bleeding.  He has right upper quadrant pain.  Feels like a day when he had his gallbladder attack.  Burning in quality.  And doubles him over.  Current Outpatient Medications  Medication Sig Dispense Refill  . ibuprofen (ADVIL,MOTRIN) 200 MG tablet Take 200 mg by mouth every 6 (six) hours as needed.    . methotrexate 2.5 MG tablet Take 10 mg by mouth 2 (two) times a week.   3  . predniSONE (DELTASONE) 5 MG tablet Take 0.5 tablets by mouth 2 (two) times a week.  2  . SUMAtriptan (IMITREX) 100 MG  tablet Take 1 tablet by mouth every 2 (two) hours as needed. For migraines  0  . tamsulosin (FLOMAX) 0.4 MG CAPS capsule Take 1 capsule by mouth daily.  1   No current facility-administered medications for this visit.     Allergies as of 06/22/2018  . (No Known Allergies)    Past Medical History:  Diagnosis Date  . Headache   . Rheumatoid arthritis [facility])     Past Surgical History:  Procedure Laterality Date  . ANKLE SURGERY Left   . CARPAL TUNNEL RELEASE Left   . CHOLECYSTECTOMY  2008  . COLONOSCOPY N/A 04/12/2016   Dr. Lovell Sheehan: normal.   . CYST EXCISION     neck  . KNEE SURGERY Right     Family History  Problem Relation Age of Onset  . Kidney cancer Father   . Alcohol abuse Father   . Colon cancer Neg Hx   . Celiac disease Neg Hx   . Inflammatory bowel disease Neg Hx     Social History   Socioeconomic History  . Marital status: Divorced    Spouse name: Not on file  . Number of children: Not on file  . Years of education: Not on file  . Highest education level: Not on file  Occupational History  . Not on file  Social Needs  . Financial resource strain: Not on file  . Food insecurity:    Worry: Not  on file    Inability: Not on file  . Transportation needs:    Medical: Not on file    Non-medical: Not on file  Tobacco Use  . Smoking status: Never Smoker  . Smokeless tobacco: Never Used  Substance and Sexual Activity  . Alcohol use: No  . Drug use: No  . Sexual activity: Not on file  Lifestyle  . Physical activity:    Days per week: Not on file    Minutes per session: Not on file  . Stress: Not on file  Relationships  . Social connections:    Talks on phone: Not on file    Gets together: Not on file    Attends religious service: Not on file    Active member of club or organization: Not on file    Attends meetings of clubs or organizations: Not on file    Relationship status: Not on file  . Intimate partner violence:    Fear of current or ex  partner: Not on file    Emotionally abused: Not on file    Physically abused: Not on file    Forced sexual activity: Not on file  Other Topics Concern  . Not on file  Social History Narrative  . Not on file      ROS:  General: Negative for anorexia, weight loss, fever, chills, fatigue, weakness. Eyes: Negative for vision changes.  ENT: Negative for hoarseness,   nasal congestion. See hpi CV: Negative for chest pain, angina, palpitations, dyspnea on exertion, peripheral edema.  Respiratory: Negative for dyspnea at rest, dyspnea on exertion, cough, sputum, wheezing.  GI: See history of present illness. GU:  Negative for dysuria, hematuria, urinary incontinence, urinary frequency, nocturnal urination.  MS: recent back pain as outlined above. +joint pain Derm: Negative for rash or itching.  Neuro: Negative for weakness, abnormal sensation, seizure, frequent headaches, memory loss, confusion.  Psych: Negative for anxiety, depression, suicidal ideation, hallucinations.  Endo: Negative for unusual weight change.  Heme: Negative for bruising or bleeding. Allergy: Negative for rash or hives.    Physical Examination:  BP 114/69   Pulse 69   Temp (!) 97 F (36.1 C) (Oral)   Ht 5\' 11"  (1.803 m)   Wt 174 lb 6.4 oz (79.1 kg)   BMI 24.32 kg/m    General: Well-nourished, well-developed in no acute distress.  Head: Normocephalic, atraumatic.   Eyes: Conjunctiva pink, no icterus. Mouth: Oropharyngeal mucosa moist and pink , no lesions erythema or exudate. Neck: Supple without thyromegaly, masses, or lymphadenopathy.  Lungs: Clear to auscultation bilaterally.  Heart: Regular rate and rhythm, no murmurs rubs or gallops.  Abdomen: Bowel sounds are normal, moderate ruq tenderness/right mid abd tenderness, nondistended, no hepatosplenomegaly or masses, no abdominal bruits or    hernia , no rebound or guarding.   Rectal: not performed  Extremities: No lower extremity edema. No clubbing or  deformities.  Neuro: Alert and oriented x 4 , grossly normal neurologically.  Skin: Warm and dry, no rash or jaundice.   Psych: Alert and cooperative, normal mood and affect.    Imaging Studies: Ct Abdomen Pelvis W Contrast  Result Date: 06/03/2018 CLINICAL DATA:  Initial evaluation for acute mid abdominal pain for 1 month, loose stools, bulging. EXAM: CT ABDOMEN AND PELVIS WITH CONTRAST TECHNIQUE: Multidetector CT imaging of the abdomen and pelvis was performed using the standard protocol following bolus administration of intravenous contrast. CONTRAST:  08/03/2018 ISOVUE-300 IOPAMIDOL (ISOVUE-300) INJECTION 61% COMPARISON:  None available. FINDINGS:  Lower chest: Visualized lung bases are clear. Hepatobiliary: Liver demonstrates a normal contrast enhanced appearance. Gallbladder surgically absent. No biliary dilatation. Pancreas: Pancreas within normal limits. No abnormal pancreatic ductal dilatation or peripancreatic inflammation. Spleen: Spleen within normal limits. Adrenals/Urinary Tract: Adrenal glands are normal. Kidneys equal in size with symmetric enhancement. No nephrolithiasis, hydronephrosis, or focal enhancing renal mass. No hydroureter. Partially distended bladder within normal limits. Stomach/Bowel: 13 mm soft tissue lesion at the right posterolateral aspect of the distal esophagus (series 2, image 11), suspicious for paraesophageal lymph node. Adjacent esophagus grossly unremarkable. Stomach within normal limits. No evidence for bowel obstruction. Appendix is normal. No abnormal wall thickening, mucosal enhancement, or inflammatory fat stranding seen about the bowels. Vascular/Lymphatic: Normal intravascular enhancement seen throughout the intra-abdominal aorta and its branch vessels. Mesenteric vessels patent proximally. No other adenopathy within the abdomen and pelvis. Reproductive: Prostate within normal limits. Other: No free air or fluid. Tiny fat containing paraumbilical hernia noted.  Musculoskeletal: No acute osseous abnormality. No worrisome lytic or blastic osseous lesions. Moderate degenerative spondylolysis noted at L5-S1. IMPRESSION: 1. No CT evidence for acute intra-abdominal or pelvic process. 2. 13 mm soft tissue lesion abutting and/or arising from the right posterolateral aspect of the distal esophagus, indeterminate. Primary differential considerations include an enlarged paraesophageal lymph node, various benign tumors including esophageal leiomyoma, or possibly a collapse esophageal diverticulum. Correlation with endoscopy and/or esophagram may be helpful for further delineation. 3. Status post cholecystectomy. Electronically Signed   By: Rise Mu M.D.   On: 06/03/2018 19:13   Impression/plan:  62 year old gentleman presenting for further evaluation of abnormal esophagus on CT scan.  13 mm soft tissue lesion either abutting or arising from the right posterior lateral aspect of the distal esophagus, indeterminate.CT was performed for right-sided abdominal pain, by PCP.  Patient has multiple other GI symptoms including heartburn, solid food esophageal dysphagia, early satiety, ongoing right upper quadrant pain.  I suspect soft tissue lesion incidental finding given upper GI symptoms would recommend EGD with dilation in the near future.  I have discussed the risks, alternatives, benefits with regards to but not limited to the risk of reaction to medication, bleeding, infection, perforation and the patient is agreeable to proceed. Written consent to be obtained.  Patient also has chronic diarrhea since cholecystectomy in 2008.  Intermittent normal stools.  Somewhat increased frequency with recent right-sided abdominal pain.  Last colonoscopy was in 2017 by Dr. Lovell Sheehan, normal.  Will screen for celiac disease.  Plan for upper endoscopy for now.  Loose stools may be secondary to bile acid diarrhea but will not exclude possibility of needing colonoscopy for further  evaluation in the near future.  Obtain labs to further evaluate abdominal pain.

## 2018-06-22 NOTE — Patient Instructions (Signed)
1. Please have your labs done at [facility]. We will contact you with results as available.  2. Upper endoscopy as scheduled. See separate instructions.

## 2018-06-23 LAB — CBC WITH DIFFERENTIAL/PLATELET
BASOS ABS: 0.1 10*3/uL (ref 0.0–0.2)
Basos: 1 %
EOS (ABSOLUTE): 0.2 10*3/uL (ref 0.0–0.4)
Eos: 3 %
HEMATOCRIT: 42.5 % (ref 37.5–51.0)
Hemoglobin: 13.7 g/dL (ref 13.0–17.7)
IMMATURE GRANULOCYTES: 0 %
Immature Grans (Abs): 0 10*3/uL (ref 0.0–0.1)
LYMPHS ABS: 1.8 10*3/uL (ref 0.7–3.1)
Lymphs: 31 %
MCH: 31.1 pg (ref 26.6–33.0)
MCHC: 32.2 g/dL (ref 31.5–35.7)
MCV: 97 fL (ref 79–97)
MONOCYTES: 1 %
MONOS ABS: 0.1 10*3/uL (ref 0.1–0.9)
NEUTROS ABS: 3.6 10*3/uL (ref 1.4–7.0)
Neutrophils: 64 %
PLATELETS: 206 10*3/uL (ref 150–450)
RBC: 4.4 x10E6/uL (ref 4.14–5.80)
RDW: 15.2 % (ref 12.3–15.4)
WBC: 5.6 10*3/uL (ref 3.4–10.8)

## 2018-06-23 LAB — COMPREHENSIVE METABOLIC PANEL
A/G RATIO: 2 (ref 1.2–2.2)
ALT: 18 IU/L (ref 0–44)
AST: 22 IU/L (ref 0–40)
Albumin: 4.3 g/dL (ref 3.6–4.8)
Alkaline Phosphatase: 95 IU/L (ref 39–117)
BILIRUBIN TOTAL: 0.3 mg/dL (ref 0.0–1.2)
BUN/Creatinine Ratio: 15 (ref 10–24)
BUN: 13 mg/dL (ref 8–27)
CHLORIDE: 109 mmol/L — AB (ref 96–106)
CO2: 25 mmol/L (ref 20–29)
Calcium: 9.3 mg/dL (ref 8.6–10.2)
Creatinine, Ser: 0.84 mg/dL (ref 0.76–1.27)
GFR calc non Af Amer: 94 mL/min/{1.73_m2} (ref >59)
GFR, EST AFRICAN AMERICAN: 108 mL/min/{1.73_m2} (ref >59)
GLOBULIN, TOTAL: 2.1 g/dL (ref 1.5–4.5)
Glucose: 93 mg/dL (ref 65–99)
POTASSIUM: 4.7 mmol/L (ref 3.5–5.2)
SODIUM: 143 mmol/L (ref 134–144)
Total Protein: 6.4 g/dL (ref 6.0–8.5)

## 2018-06-23 LAB — IGA: IGA/IMMUNOGLOBULIN A, SERUM: 329 mg/dL (ref 61–437)

## 2018-06-23 LAB — TISSUE TRANSGLUTAMINASE, IGA: Transglutaminase IgA: <2 U/mL (ref 0–3)

## 2018-06-23 NOTE — Progress Notes (Signed)
cc'ed to pcp °

## 2018-06-29 ENCOUNTER — Telehealth: Payer: Self-pay | Admitting: General Practice

## 2018-06-29 NOTE — Telephone Encounter (Signed)
Patient called in stating that he would like to have his lab results from last week.  I explained to him our policy for results.  I also let him know everything looked normal, however his Chloride was a little elevated and we will have further recommendations after Verlon Au reviews his labs.   Routing to [employer]

## 2018-07-29 ENCOUNTER — Telehealth: Payer: Self-pay

## 2018-07-29 ENCOUNTER — Encounter (HOSPITAL_COMMUNITY)
Admission: RE | Disposition: A | Discharge status: Home / Self Care | Payer: Self-pay | Source: Ambulatory Visit | Attending: Internal Medicine

## 2018-07-29 ENCOUNTER — Other Ambulatory Visit: Payer: Self-pay

## 2018-07-29 ENCOUNTER — Ambulatory Visit (HOSPITAL_COMMUNITY)
Admission: RE | Admit: 2018-07-29 | Discharge: 2018-07-29 | Disposition: A | Discharge status: Home / Self Care | Payer: Managed Care, Other (non HMO) | Source: Ambulatory Visit | Attending: Internal Medicine | Admitting: Internal Medicine

## 2018-07-29 ENCOUNTER — Encounter (HOSPITAL_COMMUNITY): Payer: Self-pay

## 2018-07-29 DIAGNOSIS — Z8051 Family history of malignant neoplasm of kidney: Secondary | ICD-10-CM | POA: Diagnosis not present

## 2018-07-29 DIAGNOSIS — Z9049 Acquired absence of other specified parts of digestive tract: Secondary | ICD-10-CM | POA: Diagnosis not present

## 2018-07-29 DIAGNOSIS — K2210 Ulcer of esophagus without bleeding: Secondary | ICD-10-CM | POA: Diagnosis not present

## 2018-07-29 DIAGNOSIS — Z811 Family history of alcohol abuse and dependence: Secondary | ICD-10-CM | POA: Diagnosis not present

## 2018-07-29 DIAGNOSIS — K210 Gastro-esophageal reflux disease with esophagitis: Secondary | ICD-10-CM | POA: Diagnosis not present

## 2018-07-29 DIAGNOSIS — R12 Heartburn: Secondary | ICD-10-CM

## 2018-07-29 DIAGNOSIS — R51 Headache: Secondary | ICD-10-CM | POA: Diagnosis not present

## 2018-07-29 DIAGNOSIS — R933 Abnormal findings on diagnostic imaging of other parts of digestive tract: Secondary | ICD-10-CM | POA: Diagnosis not present

## 2018-07-29 DIAGNOSIS — M069 Rheumatoid arthritis, unspecified: Secondary | ICD-10-CM | POA: Diagnosis not present

## 2018-07-29 DIAGNOSIS — K449 Diaphragmatic hernia without obstruction or gangrene: Secondary | ICD-10-CM | POA: Insufficient documentation

## 2018-07-29 DIAGNOSIS — R1319 Other dysphagia: Secondary | ICD-10-CM

## 2018-07-29 DIAGNOSIS — R1310 Dysphagia, unspecified: Secondary | ICD-10-CM | POA: Diagnosis present

## 2018-07-29 DIAGNOSIS — Z79899 Other long term (current) drug therapy: Secondary | ICD-10-CM | POA: Insufficient documentation

## 2018-07-29 DIAGNOSIS — K222 Esophageal obstruction: Secondary | ICD-10-CM | POA: Insufficient documentation

## 2018-07-29 DIAGNOSIS — K209 Esophagitis, unspecified: Secondary | ICD-10-CM

## 2018-07-29 DIAGNOSIS — R6881 Early satiety: Secondary | ICD-10-CM

## 2018-07-29 DIAGNOSIS — R1011 Right upper quadrant pain: Secondary | ICD-10-CM

## 2018-07-29 HISTORY — PX: ESOPHAGOGASTRODUODENOSCOPY: SHX5428

## 2018-07-29 HISTORY — PX: MALONEY DILATION: SHX5535

## 2018-07-29 SURGERY — EGD (ESOPHAGOGASTRODUODENOSCOPY)
Anesthesia: Moderate Sedation

## 2018-07-29 MED ORDER — MIDAZOLAM HCL 5 MG/5ML IJ SOLN
INTRAMUSCULAR | Status: DC | PRN
  Administered 2018-07-29: 2 mg via INTRAVENOUS
  Administered 2018-07-29 (×3): 1 mg via INTRAVENOUS

## 2018-07-29 MED ORDER — ONDANSETRON HCL 4 MG/2ML IJ SOLN
INTRAMUSCULAR | Status: DC | PRN
  Administered 2018-07-29: 4 mg via INTRAVENOUS

## 2018-07-29 MED ORDER — SODIUM CHLORIDE 0.9 % IV SOLN
INTRAVENOUS | Status: DC
  Administered 2018-07-29: 07:00:00 via INTRAVENOUS

## 2018-07-29 MED ORDER — MIDAZOLAM HCL 5 MG/5ML IJ SOLN
INTRAMUSCULAR | Status: AC
  Filled 2018-07-29: qty 5

## 2018-07-29 MED ORDER — ONDANSETRON HCL 4 MG/2ML IJ SOLN
INTRAMUSCULAR | Status: DC
Start: 2018-07-29 — End: 2018-07-29
  Filled 2018-07-29: qty 2

## 2018-07-29 MED ORDER — LIDOCAINE VISCOUS HCL 2 % MT SOLN
OROMUCOSAL | Status: AC
  Filled 2018-07-29: qty 15

## 2018-07-29 MED ORDER — LIDOCAINE VISCOUS HCL 2 % MT SOLN
OROMUCOSAL | Status: DC | PRN
  Administered 2018-07-29: 1 via OROMUCOSAL

## 2018-07-29 MED ORDER — MEPERIDINE HCL 100 MG/ML IJ SOLN
INTRAMUSCULAR | Status: DC | PRN
  Administered 2018-07-29: 25 mg

## 2018-07-29 MED ORDER — MEPERIDINE HCL 50 MG/ML IJ SOLN
INTRAMUSCULAR | Status: AC
  Filled 2018-07-29: qty 1

## 2018-07-29 NOTE — Telephone Encounter (Signed)
Received a call from AP nurse. Pt needs a 3 month apt with RMR only.

## 2018-07-29 NOTE — H&P (Signed)
@LOGO @   Primary Care Physician:  , MD Primary Gastroenterologist:  Dr. Assunta Found  Pre-Procedure History & Physical: HPI:  Gary Cole is a 62 y.o. male here for evaluation of the esophageal dysphagia, refractory reflux and abnormal died abdominal pain has improved.  Past Medical History:  Diagnosis Date  . Headache   . Rheumatoid arthritis [facility])     Past Surgical History:  Procedure Laterality Date  . ANKLE SURGERY Left   . CARPAL TUNNEL RELEASE Left   . CHOLECYSTECTOMY  2008  . COLONOSCOPY N/A 04/12/2016   Dr. 04/14/2016: normal.   . CYST EXCISION     neck  . KNEE SURGERY Right     Prior to Admission medications   Medication Sig Start Date End Date Taking? Authorizing Provider  folic acid (FOLVITE) 1 MG tablet Take 1 mg by mouth daily.   Yes [provider]  methotrexate 2.5 MG tablet Take 10 mg by mouth 2 (two) times a week.  06/16/18  Yes [provider]  predniSONE (DELTASONE) 5 MG tablet Take 0.5 tablets by mouth 2 (two) times a week. 05/03/18  Yes [provider]  SUMAtriptan (IMITREX) 100 MG tablet Take 1 tablet by mouth every 2 (two) hours as needed. For migraines 03/26/16  Yes [provider]  tamsulosin (FLOMAX) 0.4 MG CAPS capsule Take 1 capsule by mouth daily. 03/26/16  Yes [provider]    Allergies as of 06/22/2018  . (No Known Allergies)    Family History  Problem Relation Age of Onset  . Kidney cancer Father   . Alcohol abuse Father   . Colon cancer Neg Hx   . Celiac disease Neg Hx   . Inflammatory bowel disease Neg Hx     Social History   Socioeconomic History  . Marital status: Divorced    Spouse name: Not on file  . Number of children: Not on file  . Years of education: Not on file  . Highest education level: Not on file  Occupational History  . Not on file  Social Needs  . Financial resource strain: Not on file  . Food insecurity:    Worry: Not on file    Inability: Not on file  .  Transportation needs:    Medical: Not on file    Non-medical: Not on file  Tobacco Use  . Smoking status: Never Smoker  . Smokeless tobacco: Never Used  Substance and Sexual Activity  . Alcohol use: No  . Drug use: No  . Sexual activity: Not on file  Lifestyle  . Physical activity:    Days per week: Not on file    Minutes per session: Not on file  . Stress: Not on file  Relationships  . Social connections:    Talks on phone: Not on file    Gets together: Not on file    Attends religious service: Not on file    Active member of club or organization: Not on file    Attends meetings of clubs or organizations: Not on file    Relationship status: Not on file  . Intimate partner violence:    Fear of current or ex partner: Not on file    Emotionally abused: Not on file    Physically abused: Not on file    Forced sexual activity: Not on file  Other Topics Concern  . Not on file  Social History Narrative  . Not on file    Review of Systems: See HPI,  otherwise negative ROS  Physical Exam: BP 123/82   Pulse 61   Temp 97.8 F (36.6 C) (Oral)   Resp 16   Ht 5\' 11"  (1.803 m)   Wt 175 lb (79.4 kg)   SpO2 98%   BMI 24.41 kg/m  General:   Alert,  Well-developed, well-nourished, pleasant and cooperative in NAD Neck:  Supple; no masses or thyromegaly. No significant cervical adenopathy. Lungs:  Clear throughout to auscultation.   No wheezes, crackles, or rhonchi. No acute distress. Heart:  Regular rate and rhythm; no murmurs, clicks, rubs,  or gallops. Abdomen: Non-distended, normal bowel sounds.  Soft and nontender without appreciable mass or hepatosplenomegaly.  Pulses:  Normal pulses noted. Extremities:  Without clubbing or edema.  Impression/Plan:  63 year old gentleman with abnormal esophagus on CT, refractory GERD and dysphagia.  I have offered the patient EGD with possible esophageal dilation as feasible/appropriate per plan.  The risks, benefits, limitations,  alternatives and imponderables have been reviewed with the patient. Potential for esophageal dilation, biopsy, etc. have also been reviewed.  Questions have been answered. All parties agreeable.      Notice: This dictation was prepared with Dragon dictation along with smaller phrase technology. Any transcriptional errors that result from this process are unintentional and may not be corrected upon review.

## 2018-07-29 NOTE — Discharge Instructions (Signed)
EGD Discharge instructions Please read the instructions outlined below and refer to this sheet in the next few weeks. These discharge instructions provide you with general information on caring for yourself after you leave the hospital. Your doctor may also give you specific instructions. While your treatment has been planned according to the most current medical practices available, unavoidable complications occasionally occur. If you have any problems or questions after discharge, please call your doctor. ACTIVITY  You may resume your regular activity but move at a slower pace for the next 24 hours.   Take frequent rest periods for the next 24 hours.   Walking will help expel (get rid of) the air and reduce the bloated feeling in your abdomen.   No driving for 24 hours (because of the anesthesia (medicine) used during the test).   You may shower.   Do not sign any important legal documents or operate any machinery for 24 hours (because of the anesthesia used during the test).  NUTRITION  Drink plenty of fluids.   You may resume your normal diet.   Begin with a light meal and progress to your normal diet.   Avoid alcoholic beverages for 24 hours or as instructed by your caregiver.  MEDICATIONS  You may resume your normal medications unless your caregiver tells you otherwise.  WHAT YOU CAN EXPECT TODAY  You may experience abdominal discomfort such as a feeling of fullness or gas pains.  FOLLOW-UP  Your doctor will discuss the results of your test with you.  SEEK IMMEDIATE MEDICAL ATTENTION IF ANY OF THE FOLLOWING OCCUR:  Excessive nausea (feeling sick to your stomach) and/or vomiting.   Severe abdominal pain and distention (swelling).   Trouble swallowing.   Temperature over 101 F (37.8 C).   Rectal bleeding or vomiting of blood.    GERD information provided  Begin Protonix 40 mg twice daily  Office visit with [place] in 3 months  Will need a repeat CT of the dstal  esophagus to recheck the abnormal area previously seen.   Upper Endoscopy, Care After Refer to this sheet in the next few weeks. These instructions provide you with information about caring for yourself after your procedure. Your health care provider may also give you more specific instructions. Your treatment has been planned according to current medical practices, but problems sometimes occur. Call your health care provider if you have any problems or questions after your procedure. What can I expect after the procedure? After the procedure, it is common to have:  A sore throat.  Bloating.  Nausea.  Follow these instructions at home:  Follow instructions from your health care provider about what to eat or drink after your procedure.  Return to your normal activities as told by your health care provider. Ask your health care provider what activities are safe for you.  Take over-the-counter and prescription medicines only as told by your health care provider.  Do not drive for 24 hours if you received a sedative.  Keep all follow-up visits as told by your health care provider. This is important. Contact a health care provider if:  You have a sore throat that lasts longer than one day.  You have trouble swallowing. Get help right away if:  You have a fever.  You vomit blood or your vomit looks like coffee grounds.  You have bloody, black, or tarry stools.  You have a severe sore throat or you cannot swallow.  You have difficulty breathing.  You have severe  pain in your chest or belly. This information is not intended to replace advice given to you by your health care provider. Make sure you discuss any questions you have with your health care provider.   Gastroesophageal Reflux Disease, Adult Normally, food travels down the esophagus and stays in the stomach to be digested. If a person has gastroesophageal reflux disease (GERD), food and stomach acid move back up into  the esophagus. When this happens, the esophagus becomes sore and swollen (inflamed). Over time, GERD can make small holes (ulcers) in the lining of the esophagus. Follow these instructions at home: Diet  Follow a diet as told by your doctor. You may need to avoid foods and drinks such as: ? Coffee and tea (with or without caffeine). ? Drinks that contain alcohol. ? Energy drinks and sports drinks. ? Carbonated drinks or sodas. ? Chocolate and cocoa. ? Peppermint and mint flavorings. ? Garlic and onions. ? Horseradish. ? Spicy and acidic foods, such as peppers, chili powder, curry powder, vinegar, hot sauces, and BBQ sauce. ? Citrus fruit juices and citrus fruits, such as oranges, lemons, and limes. ? Tomato-based foods, such as red sauce, chili, salsa, and pizza with red sauce. ? Fried and fatty foods, such as donuts, french fries, potato chips, and high-fat dressings. ? High-fat meats, such as hot dogs, rib eye steak, sausage, ham, and bacon. ? High-fat dairy items, such as whole milk, butter, and cream cheese.  Eat small meals often. Avoid eating large meals.  Avoid drinking large amounts of liquid with your meals.  Avoid eating meals during the 2-3 hours before bedtime.  Avoid lying down right after you eat.  Do not exercise right after you eat. General instructions  Pay attention to any changes in your symptoms.  Take over-the-counter and prescription medicines only as told by your doctor. Do not take aspirin, ibuprofen, or other NSAIDs unless your doctor says it is okay.  Do not use any tobacco products, including cigarettes, chewing tobacco, and e-cigarettes. If you need help quitting, ask your doctor.  Wear loose clothes. Do not wear anything tight around your waist.  Raise (elevate) the head of your bed about 6 inches (15 cm).  Try to lower your stress. If you need help doing this, ask your doctor.  If you are overweight, lose an amount of weight that is healthy for  you. Ask your doctor about a safe weight loss goal.  Keep all follow-up visits as told by your doctor. This is important. Contact a doctor if:  You have new symptoms.  You lose weight and you do not know why it is happening.  You have trouble swallowing, or it hurts to swallow.  You have wheezing or a cough that keeps happening.  Your symptoms do not get better with treatment.  You have a hoarse voice. Get help right away if:  You have pain in your arms, neck, jaw, teeth, or back.  You feel sweaty, dizzy, or light-headed.  You have chest pain or shortness of breath.  You throw up (vomit) and your throw up looks like blood or coffee grounds.  You pass out (faint).  Your poop (stool) is bloody or black.  You cannot swallow, drink, or eat. This information is not intended to replace advice given to you by your health care provider. Make sure you discuss any questions you have with your health care provider.   Esophageal Dilatation Esophageal dilatation is a procedure to open a blocked or narrowed part  of the esophagus. The esophagus is the long tube in your throat that carries food and liquid from your mouth to your stomach. The procedure is also called esophageal dilation. You may need this procedure if you have a buildup of scar tissue in your esophagus that makes it difficult, painful, or even impossible to swallow. This can be caused by gastroesophageal reflux disease (GERD). In rare cases, people need this procedure because they have cancer of the esophagus or a problem with the way food moves through the esophagus. Sometimes you may need to have another dilatation to enlarge the opening of the esophagus gradually. Tell a health care provider about:  Any allergies you have.  All medicines you are taking, including vitamins, herbs, eye drops, creams, and over-the-counter medicines.  Any problems you or family members have had with anesthetic medicines.  Any blood  disorders you have.  Any surgeries you have had.  Any medical conditions you have.  Any antibiotic medicines you are required to take before dental procedures. What are the risks? Generally, this is a safe procedure. However, problems can occur and include:  Bleeding from a tear in the lining of the esophagus.  A hole (perforation) in the esophagus.  What happens before the procedure?  Do not eat or drink anything after midnight on the night before the procedure or as directed by your health care provider.  Ask your health care provider about changing or stopping your regular medicines. This is especially important if you are taking diabetes medicines or blood thinners.  Plan to have someone take you home after the procedure. What happens during the procedure?  You will be given a medicine that makes you relaxed and sleepy (sedative).  A medicine may be sprayed or gargled to numb the back of the throat.  Your health care provider can use various instruments to do an esophageal dilatation. During the procedure, the instrument used will be placed in your mouth and passed down into your esophagus. Options include: ? Simple dilators. This instrument is carefully placed in the esophagus to stretch it. ? Guided wire bougies. In this method, a flexible tube (endoscope) is used to insert a wire into the esophagus. The dilator is passed over this wire to enlarge the esophagus. Then the wire is removed. ? Balloon dilators. An endoscope with a small balloon at the end is passed down into the esophagus. Inflating the balloon gently stretches the esophagus and opens it up. What happens after the procedure?  Your blood pressure, heart rate, breathing rate, and blood oxygen level will be monitored often until the medicines you were given have worn off.  Your throat may feel slightly sore and will probably still feel numb. This will improve slowly over time.  You will not be allowed to eat or  drink until the throat numbness has resolved.  If this is a same-day procedure, you may be allowed to go home once you have been able to drink, urinate, and sit on the edge of the bed without nausea or dizziness.  If this is a same-day procedure, you should have a friend or family member with you for the next 24 hours after the procedure. This information is not intended to replace advice given to you by your health care provider. Make sure you discuss any questions you have with your health care provider. Document Released: 01/30/2006 Document Revised: 05/16/2016 Document Reviewed: 04/20/2014 Elsevier Interactive Patient Education  [employer].

## 2018-07-29 NOTE — Op Note (Signed)
[facility] Patient Name: Gary Cole Procedure Date: 07/29/2018 7:02 AM MRN: [id number] Date of Birth: [birth year] Attending MD: Gennette Pac , MD CSN: [id number] Age: 62 Admit Type: Outpatient Procedure:                Upper GI endoscopy Indications:              Dysphagia, Heartburn, Abnormal CT of the GI tract Providers:                Gennette Pac, MD, Jannett Celestine, RN, Dyann Ruddle Referring MD:             Corrie Mckusick MD, MD Medicines:                Midazolam 5 mg IV, Meperidine 25 mg IV Complications:            No immediate complications. Estimated Blood Loss:     Estimated blood loss was minimal. Procedure:                Pre-Anesthesia Assessment:                           - Prior to the procedure, a History and Physical                            was performed, and patient medications and                            allergies were reviewed. The patient's tolerance of                            previous anesthesia was also reviewed. The risks                            and benefits of the procedure and the sedation                            options and risks were discussed with the patient.                            All questions were answered, and informed consent                            was obtained. Prior Anticoagulants: The patient has                            taken no previous anticoagulant or antiplatelet                            agents. ASA Grade Assessment: II - A patient with                            mild systemic disease. After reviewing the risks  and benefits, the patient was deemed in                            satisfactory condition to undergo the procedure.                           After obtaining informed consent, the endoscope was                            passed under direct vision. Throughout the                            procedure, the patient's blood pressure,  pulse, and                            oxygen saturations were monitored continuously. The                            GIF-H190 ([id number]) scope was introduced through the                            mouth, and advanced to the second part of duodenum. Scope In: 7:49:42 AM Scope Out: 8:00:10 AM Total Procedure Duration: 0 hours 10 minutes 28 seconds  Findings:      Extensive distal esophageal erosions with focal areas of ulceration       straddling the GE junction superimposed on a Schatzki's ring. Linear       erosions were seen coming up a good 10 cm into the distal esophagus. No       Barrett's epithelium or tumor seen. Esophagus appeared patent throughout       its course. The scope was withdrawn. Dilation was performed with a       Maloney dilator with no resistance at 56 Fr. The dilation site was       examined following endoscope reinsertion and showed mild mucosal       disruption. Estimated blood loss was minimal.      A small hiatal hernia was present.      The exam was otherwise without abnormality.      The duodenal bulb and second portion of the duodenum were normal. Impression:               - Rather severe erosive/ulcerative esophagitis.                            Status post Maloney dilation.                           - Small hiatal hernia.                           - The examination was otherwise normal.                           - Normal duodenal bulb and second portion of the  duodenum.                           - No specimens collected. No tumor seen today.                            Lymph node seen on recent CT may well be reactive. Moderate Sedation:      Moderate (conscious) sedation was administered by the endoscopy nurse       and supervised by the endoscopist. The following parameters were       monitored: oxygen saturation, heart rate, blood pressure, respiratory       rate, EKG, adequacy of pulmonary ventilation, and response to care.        Total physician intraservice time was 18 minutes. Recommendation:           - Discharge patient to home (ambulatory).                           - Patient has a contact number available for                            emergencies. The signs and symptoms of potential                            delayed complications were discussed with the                            patient. Return to normal activities tomorrow.                            Written discharge instructions were provided to the                            patient.                           - Resume previous diet.                           - Continue present medications. Begin Protonix 40                            mg twice daily.                           - No repeat upper endoscopy.                           - Return to GI office in 3 months. Consider repeat                            CT of the GE junction about 3 months from now. Procedure Code(s):        --- Professional ---                           [phone], Esophagogastroduodenoscopy, flexible,  transoral; diagnostic, including collection of                            specimen(s) by brushing or washing, when performed                            (separate procedure)                           43450, Dilation of esophagus, by unguided sound or                            bougie, single or multiple passes                           G0500, Moderate sedation services provided by the                            same physician or other qualified health care                            professional performing a gastrointestinal                            endoscopic service that sedation supports,                            requiring the presence of an independent trained                            observer to assist in the monitoring of the                            patient's level of consciousness and physiological                            status; initial 15  minutes of intra-service time;                            patient age 69 years or older (additional time may                            be reported with 68127, as appropriate) Diagnosis Code(s):        --- Professional ---                           K20.9, Esophagitis, unspecified                           K44.9, Diaphragmatic hernia without obstruction or                            gangrene                           R13.10, Dysphagia, unspecified  R12, Heartburn                           R93.3, Abnormal findings on diagnostic imaging of                            other parts of digestive tract CPT copyright 2017 American Medical Association. All rights reserved. The codes documented in this report are preliminary and upon coder review may  be revised to meet current compliance requirements. Gerrit Friends. Brithany Whitworth, MD Gennette Pac, MD 07/29/2018 8:16:07 AM This report has been signed electronically. Number of Addenda: 0

## 2018-08-03 ENCOUNTER — Encounter (HOSPITAL_COMMUNITY): Payer: Self-pay | Admitting: Internal Medicine

## 2018-08-03 NOTE — Telephone Encounter (Signed)
REMINDER IN EPIC °

## 2018-09-14 ENCOUNTER — Ambulatory Visit: Payer: No Typology Code available for payment source | Admitting: Gastroenterology

## 2018-09-14 ENCOUNTER — Encounter

## 2018-11-10 ENCOUNTER — Ambulatory Visit: Payer: Managed Care, Other (non HMO) | Admitting: Gastroenterology

## 2019-02-03 ENCOUNTER — Other Ambulatory Visit: Payer: Self-pay | Admitting: *Deleted

## 2019-02-03 ENCOUNTER — Encounter: Payer: Self-pay | Admitting: *Deleted

## 2019-02-03 ENCOUNTER — Encounter: Payer: Self-pay | Admitting: Gastroenterology

## 2019-02-03 ENCOUNTER — Telehealth: Payer: Self-pay | Admitting: *Deleted

## 2019-02-03 ENCOUNTER — Ambulatory Visit: Payer: 59 | Admitting: Gastroenterology

## 2019-02-03 VITALS — BP 131/78 | HR 73 | Temp 97.1°F | Ht 70.0 in | Wt 172.2 lb

## 2019-02-03 DIAGNOSIS — R0989 Other specified symptoms and signs involving the circulatory and respiratory systems: Secondary | ICD-10-CM

## 2019-02-03 DIAGNOSIS — K2100 Gastro-esophageal reflux disease with esophagitis, without bleeding: Secondary | ICD-10-CM

## 2019-02-03 DIAGNOSIS — R933 Abnormal findings on diagnostic imaging of other parts of digestive tract: Secondary | ICD-10-CM | POA: Diagnosis not present

## 2019-02-03 DIAGNOSIS — R6889 Other general symptoms and signs: Secondary | ICD-10-CM

## 2019-02-03 DIAGNOSIS — K219 Gastro-esophageal reflux disease without esophagitis: Secondary | ICD-10-CM | POA: Insufficient documentation

## 2019-02-03 NOTE — Progress Notes (Signed)
cc'ed to pcp °

## 2019-02-03 NOTE — Patient Instructions (Addendum)
Please continue Protonix twice a day, 30 minutes before breakfast and dinner.   I have ordered a CT scan again to evaluate the gastroesophageal junction again.   We have also referred you to Ear, Nose, and Throat specialist.  We will see you in 6 months!  It was a pleasure to see you today. I strive to create trusting relationships with patients to provide genuine, compassionate, and quality care. I value your feedback. If you receive a survey regarding your visit,  I greatly appreciate you taking time to fill this out.   Gelene Mink, PhD, ANP-BC [facility]

## 2019-02-03 NOTE — Progress Notes (Signed)
Referring Provider: Assunta Found, MD Primary Care Physician:  Assunta Found, MD  Primary GI: Dr. Jena Gauss   Chief Complaint  Patient presents with  . procedure f/u    doing ok    HPI:   Gary Cole is a 63 y.o. male presenting today with a history of 13 mm soft tissue lesion abutting and/or arising from the right posterior lateral aspect of distal esophagus on CT in June 2019. He underwent EGD thereafter with severe erosive/ulcerative esophagitis s/p dilatation, small hiatal hernia, normal duodenum. He is here for follow-up. When last seen, he was also reporting RUQ pain and diarrhea.   PPI BID. Diarrhea resolved. Pain is now resolved. Every once in awhile feels like he has to clear his throat a lot. Has been on PPI for 6 months now. Sometimes forgets BID. Constant sinus issues.   Past Medical History:  Diagnosis Date  . Headache   . Rheumatoid arthritis [facility])     Past Surgical History:  Procedure Laterality Date  . ANKLE SURGERY Left   . CARPAL TUNNEL RELEASE Left   . CHOLECYSTECTOMY  2008  . COLONOSCOPY N/A 04/12/2016   Dr. Lovell Sheehan: normal.   . CYST EXCISION     neck  . ESOPHAGOGASTRODUODENOSCOPY N/A 07/29/2018   Dr. Jena Gauss: severe erosive/ulcerative esophagitis s/p diltaation, small hiatal hernia, normal duodenum  . KNEE SURGERY Right   . MALONEY DILATION N/A 07/29/2018   Procedure: Elease Hashimoto DILATION;  Surgeon: Corbin Ade, MD;  [facility];  Service: Endoscopy;  Laterality: N/A;    Current Outpatient Medications  Medication Sig Dispense Refill  . folic acid (FOLVITE) 1 MG tablet Take 1 mg by mouth daily.    . methotrexate 2.5 MG tablet Take 10 mg by mouth 2 (two) times a week.   3  . pantoprazole (PROTONIX) 40 MG tablet Take 40 mg by mouth 2 (two) times daily.    . predniSONE (DELTASONE) 5 MG tablet Take 0.5 tablets by mouth 2 (two) times a week.  2  . SUMAtriptan (IMITREX) 100 MG tablet Take 1 tablet by mouth every 2 (two) hours as needed. For  migraines  0  . tamsulosin (FLOMAX) 0.4 MG CAPS capsule Take 1 capsule by mouth daily.  1   No current facility-administered medications for this visit.     Allergies as of 02/03/2019  . (No Known Allergies)    Family History  Problem Relation Age of Onset  . Kidney cancer Father   . Alcohol abuse Father   . Colon cancer Neg Hx   . Celiac disease Neg Hx   . Inflammatory bowel disease Neg Hx     Social History   Socioeconomic History  . Marital status: Divorced    Spouse name: Not on file  . Number of children: Not on file  . Years of education: Not on file  . Highest education level: Not on file  Occupational History  . Not on file  Social Needs  . Financial resource strain: Not on file  . Food insecurity:    Worry: Not on file    Inability: Not on file  . Transportation needs:    Medical: Not on file    Non-medical: Not on file  Tobacco Use  . Smoking status: Never Smoker  . Smokeless tobacco: Never Used  Substance and Sexual Activity  . Alcohol use: No  . Drug use: No  . Sexual activity: Not on file  Lifestyle  . Physical  activity:    Days per week: Not on file    Minutes per session: Not on file  . Stress: Not on file  Relationships  . Social connections:    Talks on phone: Not on file    Gets together: Not on file    Attends religious service: Not on file    Active member of club or organization: Not on file    Attends meetings of clubs or organizations: Not on file    Relationship status: Not on file  Other Topics Concern  . Not on file  Social History Narrative  . Not on file    Review of Systems: Gen: Denies fever, chills, anorexia. Denies fatigue, weakness, weight loss.  CV: Denies chest pain, palpitations, syncope, peripheral edema, and claudication. Resp: Denies dyspnea at rest, cough, wheezing, coughing up blood, and pleurisy. GI: see HPI Derm: Denies rash, itching, dry skin Psych: Denies depression, anxiety, memory loss, confusion. No  homicidal or suicidal ideation.  Heme: Denies bruising, bleeding, and enlarged lymph nodes.  Physical Exam: BP 131/78   Pulse 73   Temp (!) 97.1 F (36.2 C) (Oral)   Ht 5\' 10"  (1.778 m)   Wt 172 lb 3.2 oz (78.1 kg)   BMI 24.71 kg/m  General:   Alert and oriented. No distress noted. Pleasant and cooperative.  Head:  Normocephalic and atraumatic. Eyes:  Conjuctiva clear without scleral icterus. Mouth:  Oral mucosa pink and moist.  Abdomen:  +BS, soft, non-tender and non-distended. No rebound or guarding. No HSM or masses noted. Msk:  Symmetrical without gross deformities. Normal posture. Extremities:  Without edema. Neurologic:  Alert and  oriented x4 Psych:  Alert and cooperative. Normal mood and affect.

## 2019-02-03 NOTE — Telephone Encounter (Signed)
PA for CT Abdomen was approved via [employer] website. Auth# [id number] dates 02/03/2019-08/02/2019

## 2019-02-03 NOTE — Assessment & Plan Note (Signed)
Query reactive lymphadenopathy on prior scan. I have ordered repeat CT to assess GE junction for resolution.

## 2019-02-03 NOTE — Assessment & Plan Note (Signed)
Doing well s/p EGD with dilatation and on PPI BID. RUQ and diarrhea have resolved. He does note chronic throat clearing. May have element of LPR. With chronic sinus issues as well, we discussed pursuing ENT referral. He is to continue PPI BID for now. Return in 6 months or sooner if needed.

## 2019-02-17 ENCOUNTER — Ambulatory Visit (HOSPITAL_COMMUNITY)
Admission: RE | Admit: 2019-02-17 | Discharge: 2019-02-17 | Disposition: A | Discharge status: Home / Self Care | Payer: 59 | Source: Ambulatory Visit | Attending: Gastroenterology | Admitting: Gastroenterology

## 2019-02-17 DIAGNOSIS — R933 Abnormal findings on diagnostic imaging of other parts of digestive tract: Secondary | ICD-10-CM | POA: Insufficient documentation

## 2019-02-17 LAB — POCT I-STAT CREATININE: Creatinine, Ser: 1 mg/dL (ref 0.61–1.24)

## 2019-02-17 MED ORDER — IOHEXOL 300 MG/ML  SOLN
100.0000 mL | Freq: Once | INTRAMUSCULAR | Status: AC | PRN
  Administered 2019-02-17: 100 mL via INTRAVENOUS

## 2019-02-18 NOTE — Progress Notes (Signed)
Gary Cole: the small lymph node has decreased in size and was likely reactive. This is good news! We will see you in follow-up as planned. (sent in MyChart).

## 2019-04-01 ENCOUNTER — Telehealth: Payer: Self-pay | Admitting: *Deleted

## 2019-04-01 NOTE — Telephone Encounter (Signed)
Called Dr. Avel Sensor office to check in referral. Was advised my scheduler they have tried contacting patient several times and left messages to call back but has not received a call back. FYI to AB

## 2019-08-04 ENCOUNTER — Ambulatory Visit: Payer: 59 | Admitting: Gastroenterology

## 2019-09-01 ENCOUNTER — Encounter: Payer: Self-pay | Admitting: Gastroenterology

## 2019-09-01 ENCOUNTER — Other Ambulatory Visit: Payer: Self-pay

## 2019-09-01 ENCOUNTER — Ambulatory Visit (INDEPENDENT_AMBULATORY_CARE_PROVIDER_SITE_OTHER): Payer: Managed Care, Other (non HMO) | Admitting: Gastroenterology

## 2019-09-01 VITALS — BP 143/78 | HR 57 | Temp 96.9°F | Ht 71.0 in | Wt 168.6 lb

## 2019-09-01 DIAGNOSIS — R6889 Other general symptoms and signs: Secondary | ICD-10-CM

## 2019-09-01 DIAGNOSIS — K2100 Gastro-esophageal reflux disease with esophagitis, without bleeding: Secondary | ICD-10-CM

## 2019-09-01 DIAGNOSIS — R0989 Other specified symptoms and signs involving the circulatory and respiratory systems: Secondary | ICD-10-CM

## 2019-09-01 MED ORDER — PANTOPRAZOLE SODIUM 40 MG PO TBEC: 40.0000 mg | DELAYED_RELEASE_TABLET | Freq: Every day | ORAL | 3 refills | Status: DC

## 2019-09-01 NOTE — Progress Notes (Signed)
amb re 

## 2019-09-01 NOTE — Assessment & Plan Note (Signed)
Chronic GERD, s/p EGD with ulcerative/erosive esophagitis, doing well now with Protonix daily. No alarm signs/symptoms. However, he does have persistent throat clearing, sinus pressure/drainage, and we had previously referred him to ENT. Unfortunately, they were unable to reach him. We will refer back again. Return in 1 year or sooner if needed. Protonix refilled for 1 year.

## 2019-09-01 NOTE — Progress Notes (Signed)
Referring Provider: Sharilyn Sites, MD Primary Care Physician:  Sharilyn Sites, MD Primary GI: Dr. Gala Romney   Chief Complaint  Patient presents with  . Gastroesophageal Reflux    doing ok    HPI:   Gary Cole is a 63 y.o. male presenting today with a history of chronic GERD, erosive/ulcerative esophagitis, 13 mm soft tissue lesion abutting and/or arising from the right posterior lateral aspect of distal esophagus on CT in June 2019. EGD followed, with repeat CT favoring likely small lymph node that was felt to be reactive previously and had decreased in size. He was referred to ENT at last visit due to persistent throat clearing but did not complete as ENT was unable to reach him.   Missed some doses of PPI and could tell he had missed. PPI once daily. No dysphagia. No rectal bleeding. No abdominal pain. No changes in bowel habits. He did not know ENT was trying to reach him. Still with persistent throat clearing and chronic sinus pressure/drainage.   Past Medical History:  Diagnosis Date  . GERD (gastroesophageal reflux disease)   . Headache   . Rheumatoid arthritis [facility])     Past Surgical History:  Procedure Laterality Date  . ANKLE SURGERY Left   . CARPAL TUNNEL RELEASE Left   . CHOLECYSTECTOMY  2008  . COLONOSCOPY N/A 04/12/2016   Dr. Arnoldo Morale: normal.   . CYST EXCISION     neck  . ESOPHAGOGASTRODUODENOSCOPY N/A 07/29/2018   Dr. Gala Romney: severe erosive/ulcerative esophagitis s/p diltaation, small hiatal hernia, normal duodenum  . KNEE SURGERY Right   . MALONEY DILATION N/A 07/29/2018   Procedure: Venia Minks DILATION;  Surgeon: Daneil Dolin, MD;  [facility]: [facility];  Service: Endoscopy;  Laterality: N/A;    Current Outpatient Medications  Medication Sig Dispense Refill  . folic acid (FOLVITE) 1 MG tablet Take 1 mg by mouth daily.    . methotrexate 2.5 MG tablet Take 10 mg by mouth 2 (two) times a week.   3  . pantoprazole (PROTONIX) 40 MG tablet Take 1 tablet (40 mg  total) by mouth daily. 30 minutes before breakfast. 90 tablet 3  . predniSONE (DELTASONE) 5 MG tablet Take 0.5 tablets by mouth 2 (two) times a week.  2  . SUMAtriptan (IMITREX) 100 MG tablet Take 1 tablet by mouth every 2 (two) hours as needed. For migraines  0   No current facility-administered medications for this visit.     Allergies as of 09/01/2019  . (No Known Allergies)    Family History  Problem Relation Age of Onset  . Kidney cancer Father   . Alcohol abuse Father   . Colon cancer Neg Hx   . Celiac disease Neg Hx   . Inflammatory bowel disease Neg Hx   . Colon polyps Neg Hx     Social History   Socioeconomic History  . Marital status: Divorced    Spouse name: Not on file  . Number of children: Not on file  . Years of education: Not on file  . Highest education level: Not on file  Occupational History  . Not on file  Social Needs  . Financial resource strain: Not on file  . Food insecurity    Worry: Not on file    Inability: Not on file  . Transportation needs    Medical: Not on file    Non-medical: Not on file  Tobacco Use  . Smoking status: Never Smoker  . Smokeless tobacco:  Never Used  Substance and Sexual Activity  . Alcohol use: No  . Drug use: No  . Sexual activity: Not on file  Lifestyle  . Physical activity    Days per week: Not on file    Minutes per session: Not on file  . Stress: Not on file  Relationships  . Social Musician on phone: Not on file    Gets together: Not on file    Attends religious service: Not on file    Active member of club or organization: Not on file    Attends meetings of clubs or organizations: Not on file    Relationship status: Not on file  Other Topics Concern  . Not on file  Social History Narrative  . Not on file    Review of Systems: Gen: Denies fever, chills, anorexia. Denies fatigue, weakness, weight loss.  CV: Denies chest pain, palpitations, syncope, peripheral edema, and claudication.  Resp: Denies dyspnea at rest, cough, wheezing, coughing up blood, and pleurisy. GI: Denies vomiting blood, jaundice, and fecal incontinence.   Denies dysphagia or odynophagia. Derm: Denies rash, itching, dry skin Psych: Denies depression, anxiety, memory loss, confusion. No homicidal or suicidal ideation.  Heme: Denies bruising, bleeding, and enlarged lymph nodes.  Physical Exam: BP (!) 143/78   Pulse (!) 57   Temp (!) 96.9 F (36.1 C) (Temporal)   Ht 5\' 11"  (1.803 m)   Wt 168 lb 9.6 oz (76.5 kg)   BMI 23.51 kg/m  General:   Alert and oriented. No distress noted. Pleasant and cooperative.  Head:  Normocephalic and atraumatic. Abdomen:  +BS, soft, non-tender and non-distended. No rebound or guarding. No HSM or masses noted. Msk:  Symmetrical without gross deformities. Normal posture. Extremities:  Without edema. Neurologic:  Alert and  oriented x4 Psych:  Alert and cooperative. Normal mood and affect.

## 2019-09-01 NOTE — Patient Instructions (Signed)
Continue Protonix once daily, 30 minutes before breakfast.  Please call if any problems swallowing, abdominal pain, nausea, vomiting, weight loss, change in bowel habits.  We are referring you to Dr. Benjamine Mola.  We will see you back in 1 year  I enjoyed seeing you again today! As you know, I value our relationship and want to provide genuine, compassionate, and quality care. I welcome your feedback. If you receive a survey regarding your visit,  I greatly appreciate you taking time to fill this out. See you next time!  Annitta Needs, PhD, ANP-BC [facility]

## 2019-10-21 ENCOUNTER — Other Ambulatory Visit: Payer: Self-pay

## 2019-10-21 ENCOUNTER — Ambulatory Visit (INDEPENDENT_AMBULATORY_CARE_PROVIDER_SITE_OTHER): Payer: Managed Care, Other (non HMO) | Admitting: Otolaryngology

## 2019-10-21 DIAGNOSIS — J343 Hypertrophy of nasal turbinates: Secondary | ICD-10-CM

## 2019-10-21 DIAGNOSIS — K219 Gastro-esophageal reflux disease without esophagitis: Secondary | ICD-10-CM | POA: Diagnosis not present

## 2019-10-21 DIAGNOSIS — J310 Chronic rhinitis: Secondary | ICD-10-CM | POA: Diagnosis not present

## 2019-10-21 DIAGNOSIS — J342 Deviated nasal septum: Secondary | ICD-10-CM | POA: Diagnosis not present

## 2020-02-12 IMAGING — CT CT ABD-PELV W/ CM
2 of 5 series · 16 of 46 positions shown, 18 images · IV contrast (Isovue)
Comparison: None available.

CLINICAL DATA: Initial evaluation for acute mid abdominal pain for
1 month, loose stools, bulging.

EXAM:
CT ABDOMEN AND PELVIS WITH CONTRAST
TECHNIQUE: Multidetector CT imaging of the abdomen and pelvis was performed
using the standard protocol following bolus administration of
intravenous contrast.
CONTRAST:  100mL TYPPDT-S33 IOPAMIDOL (TYPPDT-S33) INJECTION 61%

[Series 2: axial st · axial · 0.75mm/px · z∈[-597,-147]mm · 13 of 103 slices shown, 15 images]
[im 7/103  soft-tissue]
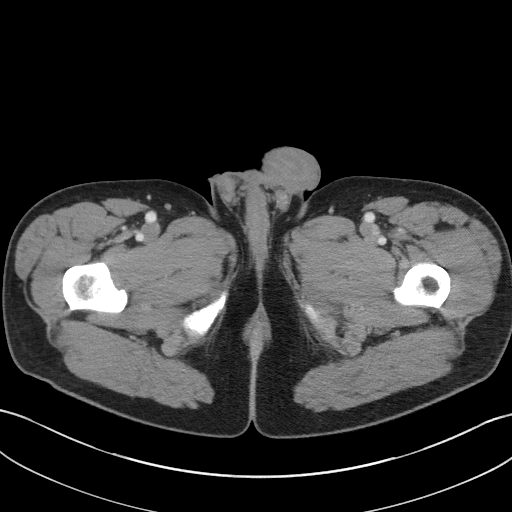
[im 7/103  bone]
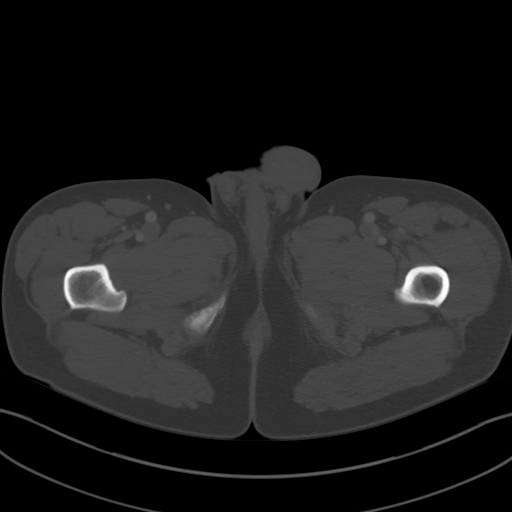
[im 13/103  soft-tissue]
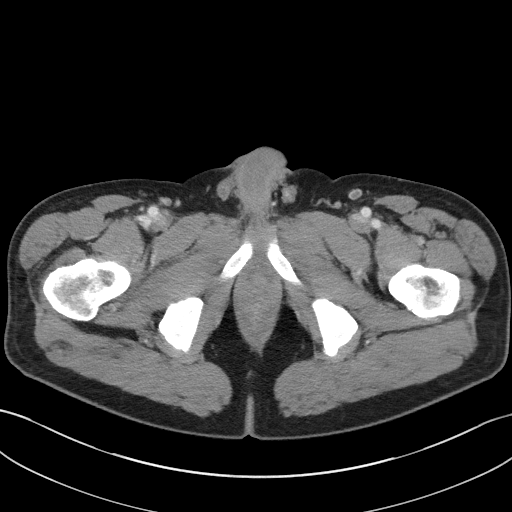
[im 25/103  soft-tissue]
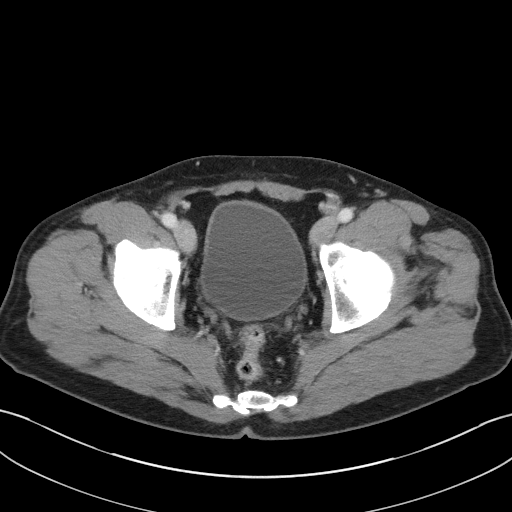
[im 31/103  soft-tissue]
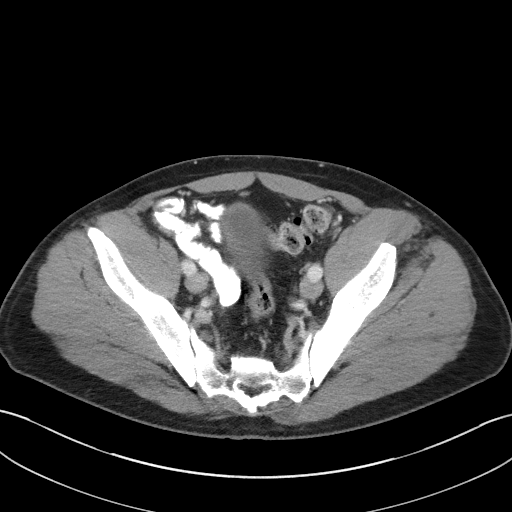
[im 37/103  soft-tissue]
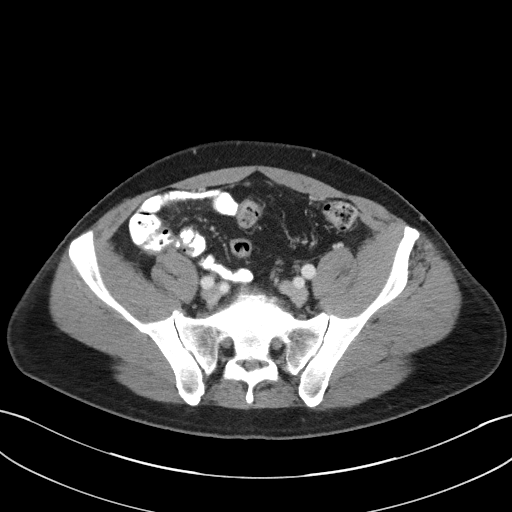
[im 43/103  soft-tissue]
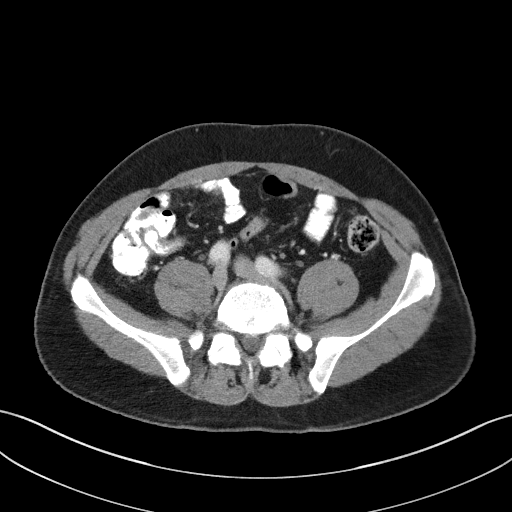
[im 55/103  soft-tissue]
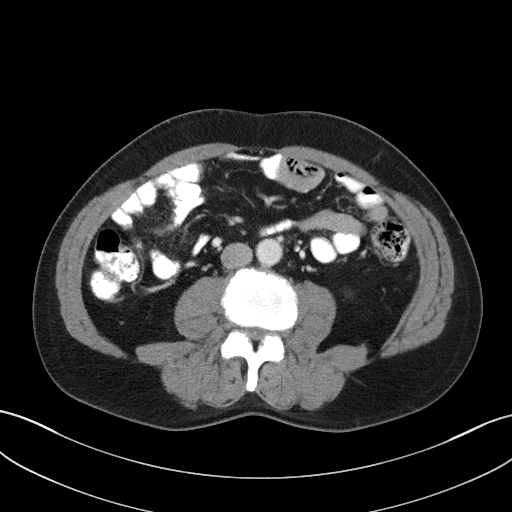
[im 61/103  soft-tissue]
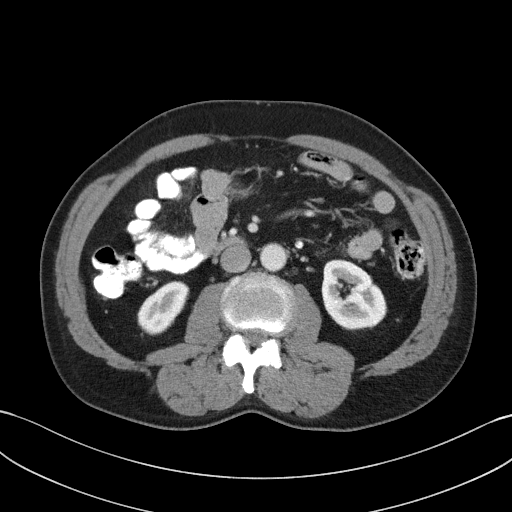
[im 67/103  soft-tissue]
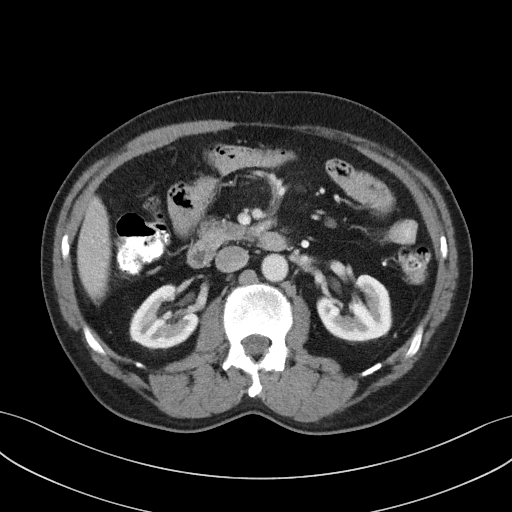
[im 67/103  bone]
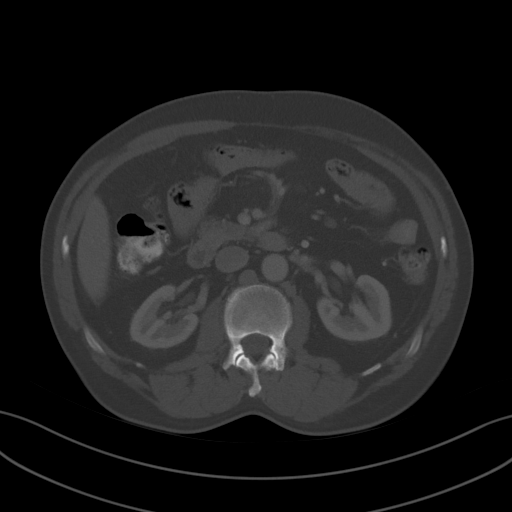
[im 73/103  soft-tissue]
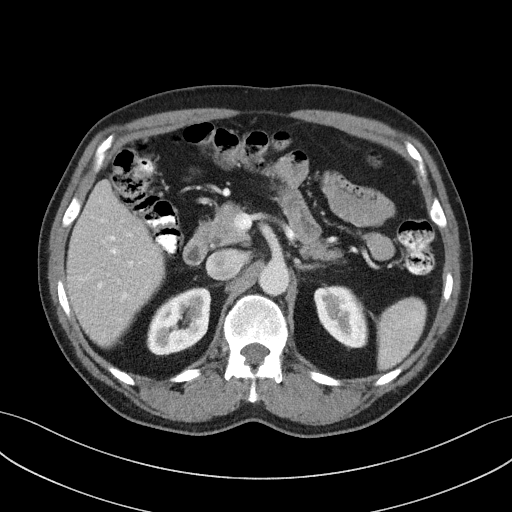
[im 79/103  soft-tissue]
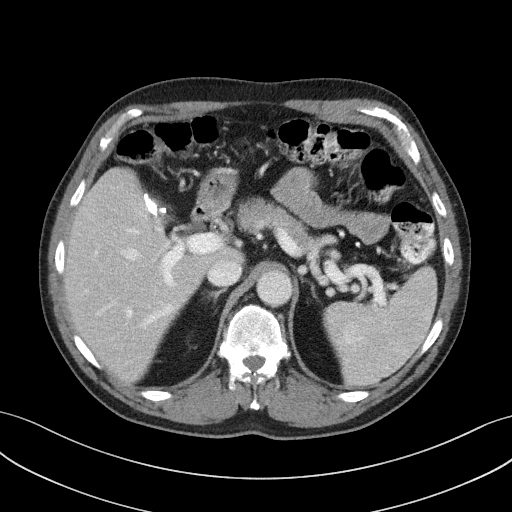
[im 91/103  soft-tissue]
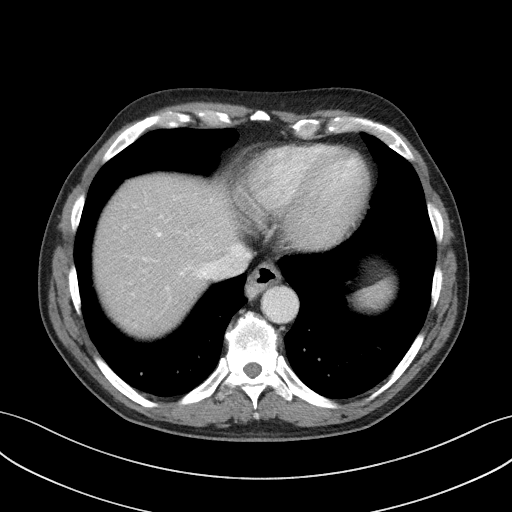
[im 97/103  soft-tissue]
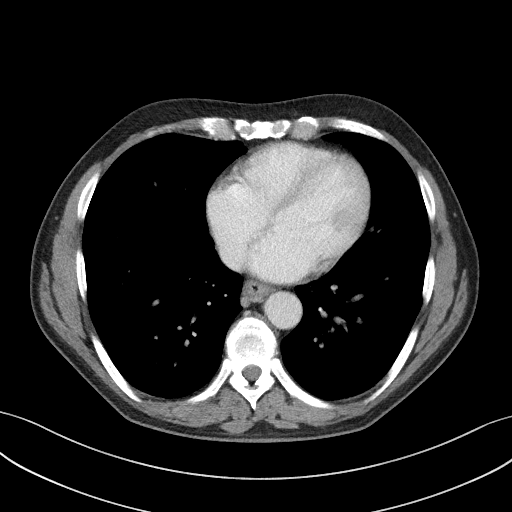

[Series 6: coronal st · coronal · 0.89mm/px · 3 of 98 slices shown]
[im 33/98  soft-tissue]
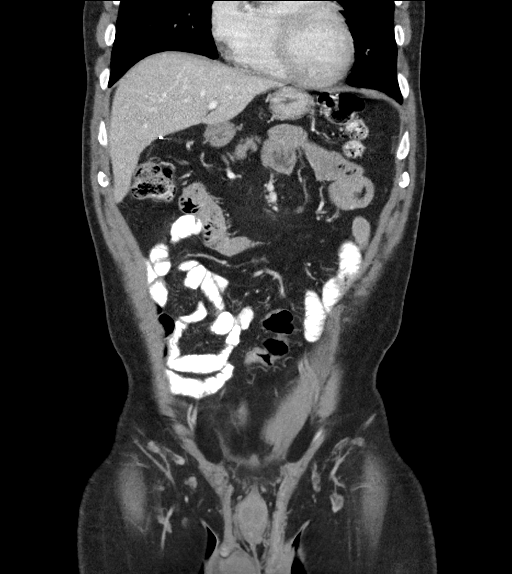
[im 44/98  soft-tissue]
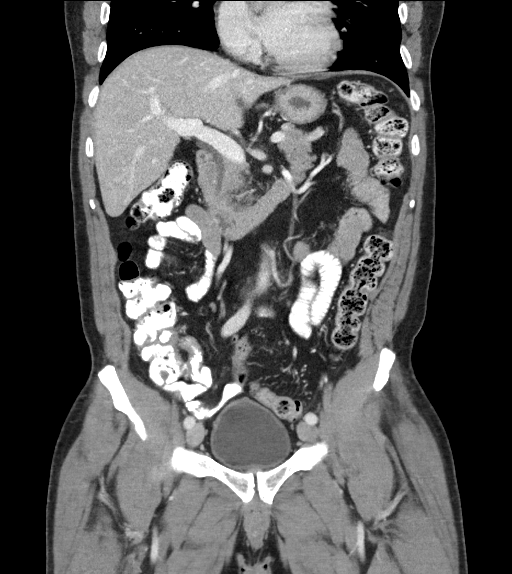
[im 54/98  soft-tissue]
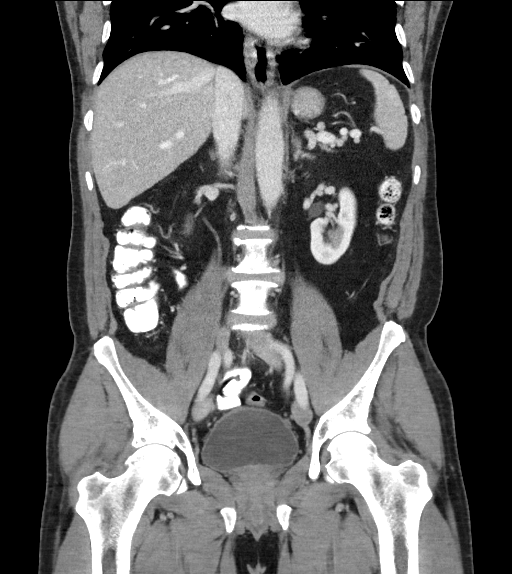

[16 of 46 positions shown; findings below may reference images not displayed]

FINDINGS: Lower chest: Visualized lung bases are clear.

Hepatobiliary: Liver demonstrates a normal contrast enhanced
appearance. Gallbladder surgically absent. No biliary dilatation.

Pancreas: Pancreas within normal limits. No abnormal pancreatic
ductal dilatation or peripancreatic inflammation.

Spleen: Spleen within normal limits.

Adrenals/Urinary Tract: Adrenal glands are normal. Kidneys equal in
size with symmetric enhancement. No nephrolithiasis, hydronephrosis,
or focal enhancing renal mass. No hydroureter. Partially distended
bladder within normal limits.

Stomach/Bowel: 13 mm soft tissue lesion at the right posterolateral
aspect of the distal esophagus (series 2, image 11), suspicious for
paraesophageal lymph node. Adjacent esophagus grossly unremarkable.
Stomach within normal limits. No evidence for bowel obstruction.
Appendix is normal. No abnormal wall thickening, mucosal
enhancement, or inflammatory fat stranding seen about the bowels.

Vascular/Lymphatic: Normal intravascular enhancement seen throughout
the intra-abdominal aorta and its branch vessels. Mesenteric vessels
patent proximally. No other adenopathy within the abdomen and
pelvis.

Reproductive: Prostate within normal limits.

Other: No free air or fluid. Tiny fat containing paraumbilical
hernia noted.

Musculoskeletal: No acute osseous abnormality. No worrisome lytic or
blastic osseous lesions. Moderate degenerative spondylolysis noted
at L5-S1.
IMPRESSION: 1. No CT evidence for acute intra-abdominal or pelvic process.
2. 13 mm soft tissue lesion abutting and/or arising from the right
posterolateral aspect of the distal esophagus, indeterminate.
Primary differential considerations include an enlarged
paraesophageal lymph node, various benign tumors including
esophageal leiomyoma, or possibly a collapse esophageal
diverticulum. Correlation with endoscopy and/or esophagram may be
helpful for further delineation.
3. Status post cholecystectomy.

## 2020-06-30 ENCOUNTER — Encounter: Payer: Self-pay | Admitting: Internal Medicine

## 2020-08-01 ENCOUNTER — Other Ambulatory Visit: Payer: Self-pay

## 2020-08-01 ENCOUNTER — Encounter: Payer: Self-pay | Admitting: Internal Medicine

## 2020-08-01 ENCOUNTER — Ambulatory Visit (INDEPENDENT_AMBULATORY_CARE_PROVIDER_SITE_OTHER): Payer: 59 | Admitting: Internal Medicine

## 2020-08-01 VITALS — BP 144/82 | HR 60 | Temp 97.1°F | Ht 70.0 in | Wt 173.4 lb

## 2020-08-01 DIAGNOSIS — K9289 Other specified diseases of the digestive system: Secondary | ICD-10-CM

## 2020-08-01 DIAGNOSIS — K219 Gastro-esophageal reflux disease without esophagitis: Secondary | ICD-10-CM

## 2020-08-01 NOTE — Patient Instructions (Signed)
Continue protonix 40 mg daily (pantoprazole)  Begin Benefiber 1 tablespoon daily  Probiotic such as digestive advantage or phillips colon health daily  Keep follow up regarding your thyroid  OV with [place] in 6 weeks

## 2020-08-01 NOTE — Progress Notes (Signed)
Primary Care Physician:  Assunta Found, MD Primary Gastroenterologist:  Dr. Jena Gauss  Pre-Procedure History & Physical: HPI:  Gary Cole is a 64 y.o. male here for evaluation of a several month history of a lower abdominal fullness after eating eats a meal which may persist for couple hours and resolves.  Sometimes he can  gets distention after eating a cheeseburger and [place] fries.  Other times he goes to a pizza buffet  and eat several pieces of pizza pulses no difficulties whatsoever..  Not necessarily temporally related to a bowel movement.  Patient states BMs come regularly 1-2 times daily has not seen any blood in his stool. At times he notes vague lower abdominal discomfort when lying his abdomen.  This is only intermittent.  There is no pattern as he reports.  Most of the time he does fine without any pain. Negative colonoscopy by Dr. Lovell Sheehan 2017.  He has not had any change in weight. Reflux and ENT symptoms he had previously have settled down.  He states his reflux is well controlled on Protonix 40 mg daily.  Saw an ENT.  Paraesophageal node noted to have regressed on follow-up CT 2020. Dysphagia whatsoever since he has esophagus dilated previously. Reported to have Hashimoto's thyroiditis with hypothyroidism diagnosed clinically recently.  Been followed closely by his PCP  Past Medical History:  Diagnosis Date  . GERD (gastroesophageal reflux disease)   . Headache   . Rheumatoid arthritis [facility])     Past Surgical History:  Procedure Laterality Date  . ANKLE SURGERY Left   . CARPAL TUNNEL RELEASE Left   . CHOLECYSTECTOMY  2008  . COLONOSCOPY N/A 04/12/2016   Dr. Lovell Sheehan: normal.   . CYST EXCISION     neck  . ESOPHAGOGASTRODUODENOSCOPY N/A 07/29/2018   Dr. Jena Gauss: severe erosive/ulcerative esophagitis s/p diltaation, small hiatal hernia, normal duodenum  . KNEE SURGERY Right   . MALONEY DILATION N/A 07/29/2018   Procedure: Elease Hashimoto DILATION;  Surgeon: Corbin Ade, MD;   [facility];  Service: Endoscopy;  Laterality: N/A;    Prior to Admission medications   Medication Sig Start Date End Date Taking? Authorizing Provider  diclofenac (VOLTAREN) 75 MG EC tablet Take 75 mg by mouth daily.   Yes [provider]  Diphenhyd-Hydrocort-Nystatin (FIRST-DUKES MOUTHWASH MT) Use as directed in the mouth or throat as needed.   Yes [provider]  folic acid (FOLVITE) 1 MG tablet Take 1 mg by mouth daily. Takes 400 mg daily.   Yes [provider]  levothyroxine (EUTHYROX) 50 MCG tablet Take 75 mcg by mouth daily before breakfast.    Yes [provider]  methotrexate 2.5 MG tablet Take 2.5 mg by mouth 2 (two) times a week. Pt takes 5 tablets on Tuesday and 4 tablets on Thursday. 06/16/18  Yes [provider]  pantoprazole (PROTONIX) 40 MG tablet Take 1 tablet (40 mg total) by mouth daily. 30 minutes before breakfast. 09/01/19  Yes Gelene Mink, NP  predniSONE (DELTASONE) 5 MG tablet Take 0.5 tablets by mouth 2 (two) times a week. 05/03/18  Yes [provider]  SUMAtriptan (IMITREX) 100 MG tablet Take 1 tablet by mouth every 2 (two) hours as needed. For migraines 03/26/16  Yes [provider]    Allergies as of 08/01/2020  . (No Known Allergies)    Family History  Problem Relation Age of Onset  . Kidney cancer Father   . Alcohol abuse Father   . Colon  cancer Neg Hx   . Celiac disease Neg Hx   . Inflammatory bowel disease Neg Hx   . Colon polyps Neg Hx     Social History   Socioeconomic History  . Marital status: Divorced    Spouse name: Not on file  . Number of children: Not on file  . Years of education: Not on file  . Highest education level: Not on file  Occupational History  . Not on file  Tobacco Use  . Smoking status: Never Smoker  . Smokeless tobacco: Never Used  Substance and Sexual Activity  . Alcohol use: No  . Drug use: No  . Sexual activity: Not on file  Other Topics  Concern  . Not on file  Social History Narrative  . Not on file   Social Determinants of Health   Financial Resource Strain:   . Difficulty of Paying Living Expenses:   Food Insecurity:   . Worried About Programme researcher, broadcasting/film/video in the Last Year:   . Barista in the Last Year:   Transportation Needs:   . Freight forwarder (Medical):   Marland Kitchen Lack of Transportation (Non-Medical):   Physical Activity:   . Days of Exercise per Week:   . Minutes of Exercise per Session:   Stress:   . Feeling of Stress :   Social Connections:   . Frequency of Communication with Friends and Family:   . Frequency of Social Gatherings with Friends and Family:   . Attends Religious Services:   . Active Member of Clubs or Organizations:   . Attends Banker Meetings:   Marland Kitchen Marital Status:   Intimate Partner Violence:   . Fear of Current or Ex-Partner:   . Emotionally Abused:   Marland Kitchen Physically Abused:   . Sexually Abused:     Review of Systems: See HPI, otherwise negative ROS  Physical Exam: BP (!) 144/82   Pulse 60   Temp (!) 97.1 F (36.2 C)   Ht 5\' 10"  (1.778 m)   Wt 173 lb 6.4 oz (78.7 kg)   BMI 24.88 kg/m  General:   Alert,  Well-developed, well-nourished, pleasant and cooperative in NAD Neck:  Supple; no masses or thyromegaly. No significant cervical adenopathy. Lungs:  Clear throughout to auscultation.   No wheezes, crackles, or rhonchi. No acute distress. Heart:  Regular rate and rhythm; no murmurs, clicks, rubs,  or gallops. Abdomen: Non-distended, normal bowel sounds.  Soft and nontender without appreciable mass or hepatosplenomegaly.  Pulses:  Normal pulses noted. Extremities:  Without clubbing or edema. Rectal: Good sphincter tone.  Prostate normal.  No masses in rectal vault.  Scant brown stool in the rectal vault.  Hemoccult negative.   Impression/Plan:    64 year old gentleman with intermittent complaint of predominantly postprandial lower abdominal distention  and discomfort.  Not at all reproducible.  Occurs sporadically.  Resolves within an hour or so.  No change in bowel function.  Denies constipation.  Clinically no bleeding.  Hemoccult negative today.  Recently diagnosed with hypothyroidism.   GI symptoms are entirely nonspecific.  Does not sound like intermittent obstruction.  Symptoms more consistent with gas bloat syndrome.  CT last year  as noted.  History of GERD and throat clearing.  This symptoms have become quiescent . Recommendations:  Continue protonix 40 mg daily (pantoprazole)  Begin Benefiber 1 tablespoon daily  Probiotic such as digestive advantage or phillips colon health daily  Keep follow up regarding your thyroid  OV  with [place] in 6 weeks         Notice: This dictation was prepared with Dragon dictation along with smaller phrase technology. Any transcriptional errors that result from this process are unintentional and may not be corrected upon review.

## 2020-09-05 ENCOUNTER — Encounter: Payer: Self-pay | Admitting: Internal Medicine

## 2020-09-05 ENCOUNTER — Other Ambulatory Visit: Payer: Self-pay

## 2020-09-05 ENCOUNTER — Ambulatory Visit: Payer: 59 | Admitting: Internal Medicine

## 2020-09-05 VITALS — BP 146/83 | HR 60 | Temp 97.5°F | Ht 70.0 in | Wt 171.8 lb

## 2020-09-05 DIAGNOSIS — K9289 Other specified diseases of the digestive system: Secondary | ICD-10-CM

## 2020-09-05 NOTE — Patient Instructions (Signed)
Continue Protonix 40 mg daily  Continue Benefiber daily   May eventually taper off on Probiotic therapy  Colonoscopy 2027 - screening  OV in 1 year

## 2020-09-05 NOTE — Progress Notes (Signed)
Primary Care Physician:  Assunta Found, MD Primary Gastroenterologist:  Dr. Jena Gauss  Pre-Procedure History & Physical: HPI:  Gary Cole is a 64 y.o. male here for recent gas bloat symptoms.  He started Benefiber and probiotic with essential resolution of the symptoms; patient associated gas bloat with fatty foods.  Taking 2 teaspoons of Benefiber daily and OTC probiotic.  No melena or rectal bleeding.  Appetite is good.  Reflux symptoms well controlled on Protonix 40 mg daily.  Weight up 2 pounds.  Due for average  screening colonoscopy 2027.  Past Medical History:  Diagnosis Date  . GERD (gastroesophageal reflux disease)   . Headache   . Rheumatoid arthritis [facility])     Past Surgical History:  Procedure Laterality Date  . ANKLE SURGERY Left   . CARPAL TUNNEL RELEASE Left   . CHOLECYSTECTOMY  2008  . COLONOSCOPY N/A 04/12/2016   Dr. Lovell Sheehan: normal.   . CYST EXCISION     neck  . ESOPHAGOGASTRODUODENOSCOPY N/A 07/29/2018   Dr. Jena Gauss: severe erosive/ulcerative esophagitis s/p diltaation, small hiatal hernia, normal duodenum  . KNEE SURGERY Right   . MALONEY DILATION N/A 07/29/2018   Procedure: Elease Hashimoto DILATION;  Surgeon: Corbin Ade, MD;  [facility];  Service: Endoscopy;  Laterality: N/A;    Prior to Admission medications   Medication Sig Start Date End Date Taking? Authorizing Provider  diclofenac (VOLTAREN) 75 MG EC tablet Take 75 mg by mouth daily.   Yes [provider]  Diphenhyd-Hydrocort-Nystatin (FIRST-DUKES MOUTHWASH MT) Use as directed in the mouth or throat as needed.   Yes [provider]  folic acid (FOLVITE) 1 MG tablet Take 1 mg by mouth daily. Takes 400 mg daily.   Yes [provider]  levothyroxine (EUTHYROX) 50 MCG tablet Take 75 mcg by mouth daily before breakfast.    Yes [provider]  methotrexate 2.5 MG tablet Take 2.5 mg by mouth 2 (two) times a week. Pt takes 5 tablets on Tuesday and 4 tablets on  Thursday. 06/16/18  Yes [provider]  pantoprazole (PROTONIX) 40 MG tablet Take 1 tablet (40 mg total) by mouth daily. 30 minutes before breakfast. 09/01/19  Yes Gelene Mink, NP  predniSONE (DELTASONE) 5 MG tablet Take 0.5 tablets by mouth 2 (two) times a week. 05/03/18  Yes [provider]  SUMAtriptan (IMITREX) 100 MG tablet Take 1 tablet by mouth every 2 (two) hours as needed. For migraines 03/26/16  Yes [provider]  Wheat Dextrin (BENEFIBER DRINK MIX PO) Take by mouth daily.   Yes [provider]    Allergies as of 09/05/2020  . (No Known Allergies)    Family History  Problem Relation Age of Onset  . Kidney cancer Father   . Alcohol abuse Father   . Colon cancer Neg Hx   . Celiac disease Neg Hx   . Inflammatory bowel disease Neg Hx   . Colon polyps Neg Hx     Social History   Socioeconomic History  . Marital status: Divorced    Spouse name: Not on file  . Number of children: Not on file  . Years of education: Not on file  . Highest education level: Not on file  Occupational History  . Not on file  Tobacco Use  . Smoking status: Never Smoker  . Smokeless tobacco: Never Used  Substance and Sexual Activity  . Alcohol use: No  . Drug use: No  . Sexual activity: Not  on file  Other Topics Concern  . Not on file  Social History Narrative  . Not on file   Social Determinants of Health   Financial Resource Strain:   . Difficulty of Paying Living Expenses: Not on file  Food Insecurity:   . Worried About Programme researcher, broadcasting/film/video in the Last Year: Not on file  . Ran Out of Food in the Last Year: Not on file  Transportation Needs:   . Lack of Transportation (Medical): Not on file  . Lack of Transportation (Non-Medical): Not on file  Physical Activity:   . Days of Exercise per Week: Not on file  . Minutes of Exercise per Session: Not on file  Stress:   . Feeling of Stress : Not on file  Social Connections:   . Frequency of  Communication with Friends and Family: Not on file  . Frequency of Social Gatherings with Friends and Family: Not on file  . Attends Religious Services: Not on file  . Active Member of Clubs or Organizations: Not on file  . Attends Banker Meetings: Not on file  . Marital Status: Not on file  Intimate Partner Violence:   . Fear of Current or Ex-Partner: Not on file  . Emotionally Abused: Not on file  . Physically Abused: Not on file  . Sexually Abused: Not on file    Review of Systems: See HPI, otherwise negative ROS  Physical Exam: BP (!) 146/83   Pulse 60   Temp (!) 97.5 F (36.4 C) (Oral)   Ht 5\' 10"  (1.778 m)   Wt 171 lb 12.8 oz (77.9 kg)   BMI 24.65 kg/m  General:   Alert,  Well-developed, well-nourished, pleasant and cooperative in NAD Heart:  Regular rate and rhythm; no murmurs, clicks, rubs,  or gallops. Abdomen: Non-distended, normal bowel sounds.  Soft and nontender without appreciable mass or hepatosplenomegaly.  Pulses:  Normal pulses noted. Extremities:  Without clubbing or edema.  Impression/Plan: 64 year old gentleman longstanding GERD well-controlled on Protonix 40 mg daily.  No alarm symptoms.  Recent gas bloat symptoms squelch with a daily fiber and probiotic therapy.  Clinically doing very well.  He is pleased with his progress.  Recommendations:Continue Protonix 40 mg daily  Continue Benefiber daily   May eventually taper off on Probiotic therapy  Colonoscopy 2027 - screening  OV in 1 year      Notice: This dictation was prepared with Dragon dictation along with smaller phrase technology. Any transcriptional errors that result from this process are unintentional and may not be corrected upon review.

## 2020-09-26 ENCOUNTER — Other Ambulatory Visit: Payer: Self-pay | Admitting: Gastroenterology

## 2021-05-14 ENCOUNTER — Other Ambulatory Visit: Payer: Self-pay | Admitting: Nurse Practitioner

## 2021-08-16 ENCOUNTER — Encounter: Payer: Self-pay | Admitting: Internal Medicine

## 2021-09-18 ENCOUNTER — Other Ambulatory Visit (HOSPITAL_COMMUNITY): Payer: Self-pay | Admitting: Physician Assistant

## 2021-09-18 ENCOUNTER — Other Ambulatory Visit: Payer: Self-pay | Admitting: Physician Assistant

## 2021-09-18 DIAGNOSIS — M5416 Radiculopathy, lumbar region: Secondary | ICD-10-CM

## 2021-09-18 DIAGNOSIS — M5126 Other intervertebral disc displacement, lumbar region: Secondary | ICD-10-CM

## 2021-10-02 ENCOUNTER — Other Ambulatory Visit: Payer: Self-pay

## 2021-10-02 ENCOUNTER — Ambulatory Visit (HOSPITAL_COMMUNITY)
Admission: RE | Admit: 2021-10-02 | Discharge: 2021-10-02 | Disposition: A | Discharge status: Home / Self Care | Payer: 59 | Source: Ambulatory Visit | Attending: Physician Assistant | Admitting: Physician Assistant

## 2021-10-02 DIAGNOSIS — M5126 Other intervertebral disc displacement, lumbar region: Secondary | ICD-10-CM | POA: Diagnosis present

## 2021-10-02 DIAGNOSIS — M5416 Radiculopathy, lumbar region: Secondary | ICD-10-CM | POA: Diagnosis not present

## 2022-05-31 ENCOUNTER — Other Ambulatory Visit: Payer: Self-pay | Admitting: Gastroenterology

## 2022-05-31 NOTE — Telephone Encounter (Signed)
Will need office visit for further refills after September.

## 2022-06-04 ENCOUNTER — Encounter: Payer: Self-pay | Admitting: Internal Medicine

## 2022-09-16 ENCOUNTER — Other Ambulatory Visit: Payer: Self-pay | Admitting: Gastroenterology

## 2022-12-05 ENCOUNTER — Ambulatory Visit (INDEPENDENT_AMBULATORY_CARE_PROVIDER_SITE_OTHER): Payer: Medicare Other

## 2022-12-05 ENCOUNTER — Ambulatory Visit (INDEPENDENT_AMBULATORY_CARE_PROVIDER_SITE_OTHER): Payer: Medicare Other | Admitting: Orthopedic Surgery

## 2022-12-05 DIAGNOSIS — M79672 Pain in left foot: Secondary | ICD-10-CM

## 2022-12-05 DIAGNOSIS — G5762 Lesion of plantar nerve, left lower limb: Secondary | ICD-10-CM

## 2022-12-08 DIAGNOSIS — G5762 Lesion of plantar nerve, left lower limb: Secondary | ICD-10-CM | POA: Diagnosis not present

## 2022-12-08 MED ORDER — LIDOCAINE HCL 1 % IJ SOLN
1.0000 mL | INTRAMUSCULAR | Status: AC | PRN
  Administered 2022-12-08: 1 mL

## 2022-12-08 MED ORDER — METHYLPREDNISOLONE ACETATE 40 MG/ML IJ SUSP
40.0000 mg | INTRAMUSCULAR | Status: AC | PRN
  Administered 2022-12-08: 40 mg via INTRA_ARTICULAR

## 2022-12-08 NOTE — Progress Notes (Signed)
Office Visit Note   Patient: Gary Cole           Date of Birth: 05-15-1956           MRN: [id number] Visit Date: 12/05/2022              Requested by: Assunta Found, MD [address] [place],  [place] [address] PCP: Assunta Found, MD  Chief Complaint  Patient presents with   Left Foot - Pain      HPI: Patient is a 66 year old gentleman is seen for initial evaluation for left forefoot pain.  Patient is status post surgery about 25 years ago.  Patient states his foot feels swollen and feels like he has a loose screw.  Assessment & Plan: Visit Diagnoses:  1. Pain in left foot     Plan: The Morton's neuroma was injected.  Recommended a stiff soled shoe to unload pressure from the midfoot.  A revision fusion is an option.  Follow-Up Instructions: No follow-ups on file.   Ortho Exam  Patient is alert, oriented, no adenopathy, well-dressed, normal affect, normal respiratory effort. Examination patient has a palpable dorsalis pedis pulse he does have pain to palpation of the base of the first metatarsal medial cuneiform joint consistent with the fibrous union.  Patient also has pain to palpation in the third webspace which is also reproduced with lateral compression consistent with a Morton's neuroma.  Imaging: No results found. No images are attached to the encounter.  Labs: No results found for: "HGBA1C", "ESRSEDRATE", "CRP", "LABURIC", "REPTSTATUS", "GRAMSTAIN", "CULT", "LABORGA"   Lab Results  Component Value Date   ALBUMIN 4.3 06/22/2018    No results found for: "MG" No results found for: "VD25OH"  No results found for: "PREALBUMIN"    Latest Ref Rng & Units 06/22/2018    3:11 PM  CBC EXTENDED  WBC 3.4 - 10.8 x10E3/uL 5.6   RBC 4.14 - 5.80 x10E6/uL 4.40   Hemoglobin 13.0 - 17.7 g/dL 01.0   HCT 27.2 - 53.6 % 42.5   Platelets 150 - 450 x10E3/uL 206   NEUT# 1.4 - 7.0 x10E3/uL 3.6   Lymph# 0.7 - 3.1 x10E3/uL 1.8      There is no height or weight on  file to calculate BMI.  Orders:  Orders Placed This Encounter  Procedures   XR Foot Complete Left   No orders of the defined types were placed in this encounter.    Procedures: Small Joint Inj: L intertarsal on 12/08/2022 11:18 AM Indications: pain and diagnostic evaluation Details: 22 G needle  Spinal Needle: No  Medications: 1 mL lidocaine 1 %; 40 mg methylPREDNISolone acetate 40 MG/ML Outcome: tolerated well, no immediate complications Procedure, treatment alternatives, risks and benefits explained, specific risks discussed. Consent was given by the patient. Immediately prior to procedure a time out was called to verify the correct patient, procedure, equipment, support staff and site/side marked as required. Patient was prepped and draped in the usual sterile fashion.      Clinical Data: No additional findings.  ROS:  All other systems negative, except as noted in the HPI. Review of Systems  Objective: Vital Signs: There were no vitals taken for this visit.  Specialty Comments:  No specialty comments available.  PMFS History: Patient Active Problem List   Diagnosis Date Noted   GERD (gastroesophageal reflux disease) 02/03/2019   RUQ pain 06/22/2018   Early satiety 06/22/2018   Diarrhea 06/22/2018   Abnormal CT scan, esophagus 06/22/2018  Esophageal dysphagia 06/22/2018   Past Medical History:  Diagnosis Date   GERD (gastroesophageal reflux disease)    Headache    Rheumatoid arthritis ([facility])     Family History  Problem Relation Age of Onset   Kidney cancer Father    Alcohol abuse Father    Colon cancer Neg Hx    Celiac disease Neg Hx    Inflammatory bowel disease Neg Hx    Colon polyps Neg Hx     Past Surgical History:  Procedure Laterality Date   ANKLE SURGERY Left    CARPAL TUNNEL RELEASE Left    CHOLECYSTECTOMY  2008   COLONOSCOPY N/A 04/12/2016   Dr. Lovell Sheehan: normal.    CYST EXCISION     neck   ESOPHAGOGASTRODUODENOSCOPY N/A 07/29/2018    Dr. Jena Gauss: severe erosive/ulcerative esophagitis s/p diltaation, small hiatal hernia, normal duodenum   KNEE SURGERY Right    MALONEY DILATION N/A 07/29/2018   Procedure: MALONEY DILATION;  Surgeon: Corbin Ade, MD;  [facility];  Service: Endoscopy;  Laterality: N/A;   Social History   Occupational History   Not on file  Tobacco Use   Smoking status: Never   Smokeless tobacco: Never  Substance and Sexual Activity   Alcohol use: No   Drug use: No   Sexual activity: Not on file

## 2023-01-02 ENCOUNTER — Encounter: Payer: Self-pay | Admitting: Orthopedic Surgery

## 2023-01-02 ENCOUNTER — Ambulatory Visit (INDEPENDENT_AMBULATORY_CARE_PROVIDER_SITE_OTHER): Payer: Medicare Other | Admitting: Orthopedic Surgery

## 2023-01-02 DIAGNOSIS — M79672 Pain in left foot: Secondary | ICD-10-CM | POA: Diagnosis not present

## 2023-01-02 DIAGNOSIS — M9689 Other intraoperative and postprocedural complications and disorders of the musculoskeletal system: Secondary | ICD-10-CM | POA: Diagnosis not present

## 2023-01-02 NOTE — Progress Notes (Signed)
Office Visit Note   Patient: Gary Cole           Date of Birth: 1956/03/06           MRN: [id number] Visit Date: 01/02/2023              Requested by: Sharilyn Sites, MD [address] [place],  [place] [address] PCP: Sharilyn Sites, MD  Chief Complaint  Patient presents with   Left Foot - Follow-up      HPI: Patient is a 67 year old gentleman who presents in follow-up for left foot pain.  His last examination he had an injection of the third webspace left foot with neuroma type symptoms.  Patient states the injection helped with swelling but did not help with pain.  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  Assessment & Plan: Visit Diagnoses:  1. 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  Risk and benefits were discussed including persistent pain over the lateral side of his foot potential for additional surgery.  Patient states he understands wished to proceed with surgery at this time.  Follow-Up Instructions: Return in about 4 weeks (around 01/30/2023).   Ortho Exam  Patient is alert, oriented, no adenopathy, well-dressed, normal affect, normal respiratory effort. Examination patient has a palpable dorsalis pedis pulse he has pain to palpation over the prominent screw and painful bony spurs to palpation of the base of the first metatarsal medial cuneiform joint.  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  Patient also has pain to palpation of the plantar aspect of the lateral metatarsals.  This may be overloading from the short first  metatarsal.  He does have good dorsiflexion of the ankle of 20 degrees.  Imaging: No results found. No images are attached to the encounter.  Labs: No results found for: "HGBA1C", "ESRSEDRATE", "CRP", "LABURIC", "REPTSTATUS", "GRAMSTAIN", "CULT", "LABORGA"   Lab Results  Component Value Date   ALBUMIN 4.3 06/22/2018    No results found for: "MG" No results found for: "VD25OH"  No results found for: "PREALBUMIN"    Latest Ref Rng & Units 06/22/2018    3:11 PM  CBC EXTENDED  WBC 3.4 - 10.8 x10E3/uL 5.6   RBC 4.14 - 5.80 x10E6/uL 4.40   Hemoglobin 13.0 - 17.7 g/dL 13.7   HCT 37.5 - 51.0 % 42.5   Platelets 150 - 450 x10E3/uL 206   NEUT# 1.4 - 7.0 x10E3/uL 3.6   Lymph# 0.7 - 3.1 x10E3/uL 1.8      There is no height or weight on file to calculate BMI.  Orders:  No orders of the defined types were placed in this encounter.  No orders of the defined types were placed in this encounter.    Procedures: No procedures performed  Clinical Data: No additional findings.  ROS:  All other systems negative, except as noted in the HPI. Review of Systems  Objective: Vital Signs: There were no vitals taken for this visit.  Specialty Comments:  No specialty comments available.  [place]  History: Patient Active Problem List   Diagnosis Date Noted   GERD (gastroesophageal reflux disease) 02/03/2019   RUQ pain 06/22/2018   Early satiety 06/22/2018   Diarrhea 06/22/2018   Abnormal CT scan, esophagus 06/22/2018   Esophageal dysphagia 06/22/2018   Past Medical History:  Diagnosis Date   GERD (gastroesophageal reflux disease)    Headache    Rheumatoid arthritis ([place])     Family History  Problem Relation Age of Onset   Kidney cancer Father    Alcohol abuse Father    Colon cancer Neg Hx    Celiac disease Neg Hx    Inflammatory bowel disease Neg Hx    Colon polyps Neg Hx     Past Surgical History:  Procedure Laterality Date   ANKLE SURGERY Left    CARPAL TUNNEL RELEASE  Left    CHOLECYSTECTOMY  2008   COLONOSCOPY N/A 04/12/2016   Dr. Arnoldo Morale: normal.    CYST EXCISION     neck   ESOPHAGOGASTRODUODENOSCOPY N/A 07/29/2018   Dr. Gala Romney: severe erosive/ulcerative esophagitis s/p diltaation, small hiatal hernia, normal duodenum   KNEE SURGERY Right    MALONEY DILATION N/A 07/29/2018   Procedure: MALONEY DILATION;  Surgeon: Daneil Dolin, MD;  [facility];  Service: Endoscopy;  Laterality: N/A;   Social History   Occupational History   Not on file  Tobacco Use   Smoking status: Never   Smokeless tobacco: Never  Substance and Sexual Activity   Alcohol use: No   Drug use: No   Sexual activity: Not on file

## 2023-01-07 DIAGNOSIS — M7989 Other specified soft tissue disorders: Secondary | ICD-10-CM | POA: Diagnosis not present

## 2023-01-07 DIAGNOSIS — R5382 Chronic fatigue, unspecified: Secondary | ICD-10-CM | POA: Diagnosis not present

## 2023-01-07 DIAGNOSIS — M0609 Rheumatoid arthritis without rheumatoid factor, multiple sites: Secondary | ICD-10-CM | POA: Diagnosis not present

## 2023-01-13 DIAGNOSIS — I10 Essential (primary) hypertension: Secondary | ICD-10-CM | POA: Diagnosis not present

## 2023-01-13 DIAGNOSIS — Z6824 Body mass index (BMI) 24.0-24.9, adult: Secondary | ICD-10-CM | POA: Diagnosis not present

## 2023-01-13 DIAGNOSIS — M069 Rheumatoid arthritis, unspecified: Secondary | ICD-10-CM | POA: Diagnosis not present

## 2023-01-28 ENCOUNTER — Ambulatory Visit (INDEPENDENT_AMBULATORY_CARE_PROVIDER_SITE_OTHER): Payer: Medicare Other | Admitting: Internal Medicine

## 2023-01-28 ENCOUNTER — Other Ambulatory Visit: Payer: Self-pay

## 2023-01-28 ENCOUNTER — Encounter: Payer: Self-pay | Admitting: Internal Medicine

## 2023-01-28 VITALS — BP 116/70 | HR 72 | Temp 97.6°F | Ht 70.0 in | Wt 163.6 lb

## 2023-01-28 DIAGNOSIS — R197 Diarrhea, unspecified: Secondary | ICD-10-CM

## 2023-01-28 DIAGNOSIS — R1319 Other dysphagia: Secondary | ICD-10-CM | POA: Diagnosis not present

## 2023-01-28 NOTE — Patient Instructions (Signed)
Good to see you again today!  Glad you are now doing well without any GI symptoms  Continue Protonix or pantoprazole 40 mg daily -ideally taken 30 minutes before breakfast every day.  You should take this medication for the rest of your life.  Plan for average risk screening colonoscopy-due 2027.  We will check stool for occult blood.  If positive, we will schedule a colonoscopy now  Further recommendations to follow.

## 2023-01-28 NOTE — Progress Notes (Signed)
Primary Care Physician:  Sharilyn Sites, MD Primary Gastroenterologist:  Dr. Gala Romney  Pre-Procedure History & Physical: HPI:  Gary Cole is a 67 y.o. male here for for 1 week history of gas bloating, bilateral lower quadrant abdominal pain a couple of months ago lasted for a week.  Made an appointment here.  Symptoms have resolved.  He states e has good bowel function 1 stool daily no blood no melena no diarrhea denies constipation history of complicated reflux esophagitis with stricture dilated by me previously.  No Barrett's esophagus though symptoms are controlled on Protonix 40 mg daily.  No further dysphagia.  Rheumatoid arthritis well-controlled on the regimen as chronicled.  2 years ago for gas bloat symptoms component nicely to fiber supplementation and probiotic.  Both of which he has stopped.  He recently lost his fiance to colon cancer.  Past Medical History:  Diagnosis Date   GERD (gastroesophageal reflux disease)    Headache    Rheumatoid arthritis ([place])     Past Surgical History:  Procedure Laterality Date   ANKLE SURGERY Left    CARPAL TUNNEL RELEASE Left    CHOLECYSTECTOMY  2008   COLONOSCOPY N/A 04/12/2016   Dr. Arnoldo Morale: normal.    CYST EXCISION     neck   ESOPHAGOGASTRODUODENOSCOPY N/A 07/29/2018   Dr. Gala Romney: severe erosive/ulcerative esophagitis s/p diltaation, small hiatal hernia, normal duodenum   KNEE SURGERY Right    MALONEY DILATION N/A 07/29/2018   Procedure: MALONEY DILATION;  Surgeon: Daneil Dolin, MD;  [facility];  Service: Endoscopy;  Laterality: N/A;    Prior to Admission medications   Medication Sig Start Date End Date Taking? Authorizing Provider  etanercept (ENBREL SURECLICK) 50 MG/ML injection as directed Subcutaneous weekly for 30 days   Yes [provider]  folic acid (FOLVITE) 1 MG tablet Take 1 mg by mouth daily. Takes 400 mg daily.   Yes [provider]  methotrexate 2.5 MG tablet Take 2.5 mg by mouth 2  (two) times a week. Pt takes 5 tablets on Tuesday and 4 tablets on Thursday. 06/16/18  Yes [provider]  olmesartan (BENICAR) 40 MG tablet Take 40 mg by mouth daily. 01/13/23  Yes [provider]  pantoprazole (PROTONIX) 40 MG tablet TAKE 1 TABLET BY MOUTH ONCE DAILY BEFORE BREAKFAST 09/16/22  Yes Kimberlyann Hollar, Cristopher Estimable, MD  predniSONE (DELTASONE) 5 MG tablet Take 0.5 tablets by mouth 2 (two) times a week. 05/03/18  Yes [provider]  Saw Palmetto 450 MG CAPS Take 1 capsule by mouth daily.   Yes [provider]  SUMAtriptan (IMITREX) 100 MG tablet Take 1 tablet by mouth every 2 (two) hours as needed. For migraines 03/26/16  Yes [provider]    Allergies as of 01/28/2023   (No Known Allergies)    Family History  Problem Relation Age of Onset   Kidney cancer Father    Alcohol abuse Father    Colon cancer Neg Hx    Celiac disease Neg Hx    Inflammatory bowel disease Neg Hx    Colon polyps Neg Hx     Social History   Socioeconomic History   Marital status: Divorced    Spouse name: Not on file   Number of children: Not on file   Years of education: Not on file   Highest education level: Not on file  Occupational History   Not on file  Tobacco Use   Smoking status: Never  Smokeless tobacco: Never  Substance and Sexual Activity   Alcohol use: No   Drug use: No   Sexual activity: Not on file  Other Topics Concern   Not on file  Social History Narrative   Not on file   Social Determinants of Health   Financial Resource Strain: Not on file  Food Insecurity: Not on file  Transportation Needs: Not on file  Physical Activity: Not on file  Stress: Not on file  Social Connections: Not on file  Intimate Partner Violence: Not on file    Review of Systems: See HPI, otherwise negative ROS  Physical Exam: BP 116/70 (BP Location: Right Arm, Patient Position: Sitting, Cuff Size: Normal)   Pulse 72   Temp 97.6 F (36.4 C) (Oral)   Ht 5'  10" (1.778 m)   Wt 163 lb 9.6 oz (74.2 kg)   SpO2 95%   BMI 23.47 kg/m  General:   Alert,  , well-nourished, pleasant and cooperative in NAD Neck:  Supple; no masses or thyromegaly. No significant cervical adenopathy. Lungs:  Clear throughout to auscultation.   No wheezes, crackles, or rhonchi. No acute distress. Heart:  Regular rate and rhythm; no murmurs, clicks, rubs,  or gallops. Abdomen: Non-distended, normal bowel sounds.  Soft and nontender without appreciable mass or hepatosplenomegaly.  Pulses:  Normal pulses noted. Extremities:  Without clubbing or edema.  Impression/Plan: 67 year old gentleman with a history of gas bloat symptoms quiescent over the past 3 years.  He reported a 1 week history of bilateral lower quadrant abdominal pain without much in the way of other associated symptoms.  This was several weeks ago.  Symptoms have resolved.  He feels well at this time.  History of complicated GERD now well-controlled on Protonix daily no recurrent dysphagia.  Bowel function is good.  Patient suffered a recent self-limiting illness etiology which is not well-defined.  He remains up-to-date rectal cancer screening.  He ought to have stool checked for occult blood.  Recommendations  Continue Protonix or pantoprazole 40 mg daily -ideally taken 30 minutes before breakfast every day.  Y patient to take medication indefinitely.  Plan for average risk screening colonoscopy-due 2027.  We will check stool for occult blood.  If positive, we will schedule a colonoscopy now  Further recommendations to follow.    Notice: This dictation was prepared with Dragon dictation along with smaller phrase technology. Any transcriptional errors that result from this process are unintentional and may not be corrected upon review.

## 2023-02-03 ENCOUNTER — Ambulatory Visit (INDEPENDENT_AMBULATORY_CARE_PROVIDER_SITE_OTHER): Payer: Medicare Other | Admitting: Gastroenterology

## 2023-02-03 ENCOUNTER — Other Ambulatory Visit: Payer: Self-pay

## 2023-02-03 DIAGNOSIS — R197 Diarrhea, unspecified: Secondary | ICD-10-CM

## 2023-02-03 LAB — IFOBT (OCCULT BLOOD): IFOBT: NEGATIVE

## 2023-02-06 ENCOUNTER — Encounter (HOSPITAL_COMMUNITY): Payer: Self-pay | Admitting: Orthopedic Surgery

## 2023-02-06 ENCOUNTER — Other Ambulatory Visit: Payer: Self-pay

## 2023-02-07 ENCOUNTER — Ambulatory Visit (HOSPITAL_COMMUNITY): Payer: Medicare Other | Admitting: Certified Registered"

## 2023-02-07 ENCOUNTER — Ambulatory Visit (HOSPITAL_COMMUNITY)
Admission: RE | Admit: 2023-02-07 | Discharge: 2023-02-07 | Disposition: A | Discharge status: Home / Self Care | Payer: Medicare Other | Source: Ambulatory Visit | Attending: Orthopedic Surgery | Admitting: Orthopedic Surgery

## 2023-02-07 ENCOUNTER — Encounter (HOSPITAL_COMMUNITY): Payer: Self-pay | Admitting: Orthopedic Surgery

## 2023-02-07 ENCOUNTER — Encounter (HOSPITAL_COMMUNITY)
Admission: RE | Disposition: A | Discharge status: Home / Self Care | Payer: Self-pay | Source: Ambulatory Visit | Attending: Orthopedic Surgery

## 2023-02-07 ENCOUNTER — Ambulatory Visit (HOSPITAL_COMMUNITY): Payer: Medicare Other

## 2023-02-07 ENCOUNTER — Ambulatory Visit (HOSPITAL_BASED_OUTPATIENT_CLINIC_OR_DEPARTMENT_OTHER): Payer: Medicare Other | Admitting: Certified Registered"

## 2023-02-07 ENCOUNTER — Other Ambulatory Visit: Payer: Self-pay

## 2023-02-07 DIAGNOSIS — M960 Pseudarthrosis after fusion or arthrodesis: Secondary | ICD-10-CM | POA: Diagnosis not present

## 2023-02-07 DIAGNOSIS — K219 Gastro-esophageal reflux disease without esophagitis: Secondary | ICD-10-CM | POA: Insufficient documentation

## 2023-02-07 DIAGNOSIS — M069 Rheumatoid arthritis, unspecified: Secondary | ICD-10-CM | POA: Insufficient documentation

## 2023-02-07 DIAGNOSIS — M96810 Intraoperative hemorrhage and hematoma of a musculoskeletal structure complicating a musculoskeletal system procedure: Secondary | ICD-10-CM

## 2023-02-07 DIAGNOSIS — Z472 Encounter for removal of internal fixation device: Secondary | ICD-10-CM | POA: Diagnosis not present

## 2023-02-07 DIAGNOSIS — M9689 Other intraoperative and postprocedural complications and disorders of the musculoskeletal system: Secondary | ICD-10-CM

## 2023-02-07 DIAGNOSIS — M1990 Unspecified osteoarthritis, unspecified site: Secondary | ICD-10-CM | POA: Diagnosis not present

## 2023-02-07 DIAGNOSIS — I10 Essential (primary) hypertension: Secondary | ICD-10-CM | POA: Insufficient documentation

## 2023-02-07 DIAGNOSIS — Z79899 Other long term (current) drug therapy: Secondary | ICD-10-CM | POA: Diagnosis not present

## 2023-02-07 HISTORY — PX: FOOT ARTHRODESIS: SHX1655

## 2023-02-07 HISTORY — DX: Essential (primary) hypertension: I10

## 2023-02-07 LAB — CBC
HCT: 44.7 % (ref 39.0–52.0)
Hemoglobin: 15 g/dL (ref 13.0–17.0)
MCH: 32.9 pg (ref 26.0–34.0)
MCHC: 33.6 g/dL (ref 30.0–36.0)
MCV: 98 fL (ref 80.0–100.0)
Platelets: 151 10*3/uL (ref 150–400)
RBC: 4.56 MIL/uL (ref 4.22–5.81)
RDW: 13.6 % (ref 11.5–15.5)
WBC: 4.1 10*3/uL (ref 4.0–10.5)
nRBC: 0 % (ref 0.0–0.2)

## 2023-02-07 LAB — BASIC METABOLIC PANEL
Anion gap: 10 (ref 5–15)
BUN: 12 mg/dL (ref 8–23)
CO2: 26 mmol/L (ref 22–32)
Calcium: 9.1 mg/dL (ref 8.9–10.3)
Chloride: 105 mmol/L (ref 98–111)
Creatinine, Ser: 1.15 mg/dL (ref 0.61–1.24)
GFR, Estimated: >60 mL/min (ref >60)
Glucose, Bld: 97 mg/dL (ref 70–99)
Potassium: 3.7 mmol/L (ref 3.5–5.1)
Sodium: 141 mmol/L (ref 135–145)

## 2023-02-07 SURGERY — FUSION, JOINT, FOOT
Anesthesia: General | Site: Foot | Laterality: Left

## 2023-02-07 MED ORDER — CHLORHEXIDINE GLUCONATE 0.12 % MT SOLN
OROMUCOSAL | Status: AC
  Administered 2023-02-07: 15 mL via OROMUCOSAL
  Filled 2023-02-07: qty 15

## 2023-02-07 MED ORDER — LIDOCAINE 2% (20 MG/ML) 5 ML SYRINGE
INTRAMUSCULAR | Status: DC | PRN
  Administered 2023-02-07: 60 mg via INTRAVENOUS

## 2023-02-07 MED ORDER — ACETAMINOPHEN 10 MG/ML IV SOLN
1000.0000 mg | Freq: Once | INTRAVENOUS | Status: DC | PRN
  Administered 2023-02-07: 1000 mg via INTRAVENOUS

## 2023-02-07 MED ORDER — DEXAMETHASONE SODIUM PHOSPHATE 10 MG/ML IJ SOLN
INTRAMUSCULAR | Status: DC | PRN
  Administered 2023-02-07: 10 mg via INTRAVENOUS

## 2023-02-07 MED ORDER — FENTANYL CITRATE (PF) 250 MCG/5ML IJ SOLN
INTRAMUSCULAR | Status: AC
  Filled 2023-02-07: qty 5

## 2023-02-07 MED ORDER — FENTANYL CITRATE (PF) 250 MCG/5ML IJ SOLN
INTRAMUSCULAR | Status: DC | PRN
  Administered 2023-02-07 (×2): 50 ug via INTRAVENOUS

## 2023-02-07 MED ORDER — OXYCODONE HCL 5 MG PO TABS
ORAL_TABLET | ORAL | Status: AC
  Filled 2023-02-07: qty 1

## 2023-02-07 MED ORDER — OXYCODONE HCL 5 MG/5ML PO SOLN: 5.0000 mg | Freq: Once | ORAL | Status: AC | PRN

## 2023-02-07 MED ORDER — 0.9 % SODIUM CHLORIDE (POUR BTL) OPTIME
TOPICAL | Status: DC | PRN
  Administered 2023-02-07: 1000 mL

## 2023-02-07 MED ORDER — PROMETHAZINE HCL 25 MG/ML IJ SOLN: 6.2500 mg | INTRAMUSCULAR | Status: DC | PRN

## 2023-02-07 MED ORDER — ORAL CARE MOUTH RINSE: 15.0000 mL | Freq: Once | OROMUCOSAL | Status: AC

## 2023-02-07 MED ORDER — PROPOFOL 10 MG/ML IV BOLUS
INTRAVENOUS | Status: AC
  Filled 2023-02-07: qty 20

## 2023-02-07 MED ORDER — OXYCODONE HCL 5 MG PO TABS
5.0000 mg | ORAL_TABLET | Freq: Once | ORAL | Status: AC | PRN
  Administered 2023-02-07: 5 mg via ORAL

## 2023-02-07 MED ORDER — HYDROMORPHONE HCL 1 MG/ML IJ SOLN
INTRAMUSCULAR | Status: AC
  Filled 2023-02-07: qty 1

## 2023-02-07 MED ORDER — MIDAZOLAM HCL 2 MG/2ML IJ SOLN
INTRAMUSCULAR | Status: AC
  Filled 2023-02-07: qty 2

## 2023-02-07 MED ORDER — MEPERIDINE HCL 25 MG/ML IJ SOLN: 6.2500 mg | INTRAMUSCULAR | Status: DC | PRN

## 2023-02-07 MED ORDER — MIDAZOLAM HCL 2 MG/2ML IJ SOLN
INTRAMUSCULAR | Status: DC | PRN
  Administered 2023-02-07: 2 mg via INTRAVENOUS

## 2023-02-07 MED ORDER — ACETAMINOPHEN 10 MG/ML IV SOLN
INTRAVENOUS | Status: AC
  Filled 2023-02-07: qty 100

## 2023-02-07 MED ORDER — LACTATED RINGERS IV SOLN: INTRAVENOUS | Status: DC

## 2023-02-07 MED ORDER — CHLORHEXIDINE GLUCONATE 0.12 % MT SOLN: 15.0000 mL | Freq: Once | OROMUCOSAL | Status: AC

## 2023-02-07 MED ORDER — AMISULPRIDE (ANTIEMETIC) 5 MG/2ML IV SOLN: 10.0000 mg | Freq: Once | INTRAVENOUS | Status: DC | PRN

## 2023-02-07 MED ORDER — PROPOFOL 10 MG/ML IV BOLUS
INTRAVENOUS | Status: DC | PRN
  Administered 2023-02-07: 200 mg via INTRAVENOUS

## 2023-02-07 MED ORDER — CEFAZOLIN SODIUM-DEXTROSE 2-4 GM/100ML-% IV SOLN
INTRAVENOUS | Status: AC
  Filled 2023-02-07: qty 100

## 2023-02-07 MED ORDER — ONDANSETRON HCL 4 MG/2ML IJ SOLN
INTRAMUSCULAR | Status: DC | PRN
  Administered 2023-02-07: 4 mg via INTRAVENOUS

## 2023-02-07 MED ORDER — EPHEDRINE SULFATE-NACL 50-0.9 MG/10ML-% IV SOSY
PREFILLED_SYRINGE | INTRAVENOUS | Status: DC | PRN
  Administered 2023-02-07 (×2): 5 mg via INTRAVENOUS
  Administered 2023-02-07: 10 mg via INTRAVENOUS
  Administered 2023-02-07: 5 mg via INTRAVENOUS

## 2023-02-07 MED ORDER — CEFAZOLIN SODIUM-DEXTROSE 2-4 GM/100ML-% IV SOLN
2.0000 g | INTRAVENOUS | Status: AC
  Administered 2023-02-07: 2 g via INTRAVENOUS

## 2023-02-07 MED ORDER — HYDROMORPHONE HCL 1 MG/ML IJ SOLN
0.2500 mg | INTRAMUSCULAR | Status: DC | PRN
  Administered 2023-02-07 (×4): 0.5 mg via INTRAVENOUS

## 2023-02-07 MED ORDER — OXYCODONE-ACETAMINOPHEN 5-325 MG PO TABS: 1.0000 | ORAL_TABLET | ORAL | 0 refills | Status: DC | PRN

## 2023-02-07 SURGICAL SUPPLY — 44 items
BAG COUNTER SPONGE SURGICOUNT (BAG) ×2 IMPLANT
BAG SPNG CNTER NS LX DISP (BAG) ×1
BLADE SAW SGTL HD 18.5X60.5X1. (BLADE) ×2 IMPLANT
BLADE SURG 21 STRL SS (BLADE) IMPLANT
BNDG CMPR 5X4 CHSV STRCH STRL (GAUZE/BANDAGES/DRESSINGS) ×1
BNDG COHESIVE 4X5 TAN STRL (GAUZE/BANDAGES/DRESSINGS) ×2 IMPLANT
BNDG COHESIVE 4X5 TAN STRL LF (GAUZE/BANDAGES/DRESSINGS) IMPLANT
BNDG GAUZE DERMACEA FLUFF 4 (GAUZE/BANDAGES/DRESSINGS) ×4 IMPLANT
BNDG GZE DERMACEA 4 6PLY (GAUZE/BANDAGES/DRESSINGS) ×1
COTTON STERILE ROLL (GAUZE/BANDAGES/DRESSINGS) ×2 IMPLANT
COVER SURGICAL LIGHT HANDLE (MISCELLANEOUS) ×4 IMPLANT
DRAPE INCISE IOBAN 66X45 STRL (DRAPES) ×2 IMPLANT
DRAPE OEC MINIVIEW 54X84 (DRAPES) IMPLANT
DRAPE U-SHAPE 47X51 STRL (DRAPES) ×2 IMPLANT
DRSG ADAPTIC 3X8 NADH LF (GAUZE/BANDAGES/DRESSINGS) ×2 IMPLANT
DURAPREP 26ML APPLICATOR (WOUND CARE) ×2 IMPLANT
ELECT REM PT RETURN 9FT ADLT (ELECTROSURGICAL) ×1
ELECTRODE REM PT RTRN 9FT ADLT (ELECTROSURGICAL) ×2 IMPLANT
GAUZE PAD ABD 8X10 STRL (GAUZE/BANDAGES/DRESSINGS) IMPLANT
GAUZE SPONGE 4X4 12PLY STRL (GAUZE/BANDAGES/DRESSINGS) ×2 IMPLANT
GLOVE BIOGEL PI IND STRL 9 (GLOVE) ×2 IMPLANT
GLOVE SURG ORTHO 9.0 STRL STRW (GLOVE) ×2 IMPLANT
GOWN STRL REUS W/ TWL XL LVL3 (GOWN DISPOSABLE) ×6 IMPLANT
GOWN STRL REUS W/TWL XL LVL3 (GOWN DISPOSABLE) ×3
GUIDEWIRE 1.6 6IN (WIRE) IMPLANT
KIT BASIN OR (CUSTOM PROCEDURE TRAY) ×2 IMPLANT
KIT STAPLE ARCUS 16X13 STRL (Staple) IMPLANT
KIT TURNOVER KIT B (KITS) ×2 IMPLANT
MANIFOLD NEPTUNE II (INSTRUMENTS) ×2 IMPLANT
NS IRRIG 1000ML POUR BTL (IV SOLUTION) ×2 IMPLANT
PACK ORTHO EXTREMITY (CUSTOM PROCEDURE TRAY) ×2 IMPLANT
PAD ARMBOARD 7.5X6 YLW CONV (MISCELLANEOUS) ×4 IMPLANT
PAD CAST 4YDX4 CTTN HI CHSV (CAST SUPPLIES) ×2 IMPLANT
PADDING CAST COTTON 4X4 STRL (CAST SUPPLIES) ×1
PUTTY DBM STAGRAFT PLUS 2CC (Putty) IMPLANT
SCREW CANN SHT HDLS 4X40 (Screw) IMPLANT
SPONGE T-LAP 18X18 ~~LOC~~+RFID (SPONGE) ×2 IMPLANT
SUCTION FRAZIER HANDLE 10FR (MISCELLANEOUS) ×1
SUCTION TUBE FRAZIER 10FR DISP (MISCELLANEOUS) ×2 IMPLANT
SUT ETHILON 2 0 PSLX (SUTURE) ×6 IMPLANT
TOWEL GREEN STERILE (TOWEL DISPOSABLE) ×2 IMPLANT
TOWEL GREEN STERILE FF (TOWEL DISPOSABLE) ×2 IMPLANT
TUBE CONNECTING 12X1/4 (SUCTIONS) ×2 IMPLANT
WATER STERILE IRR 1000ML POUR (IV SOLUTION) ×2 IMPLANT

## 2023-02-07 NOTE — Progress Notes (Signed)
Orthopedic Tech Progress Note Patient Details:  Gary Cole 06-16-1956 IN:[id number]  CAM walker was applied by PACU RN.  Ortho Devices Type of Ortho Device: CAM walker Ortho Device/Splint Location: LLE Ortho Device/Splint Interventions: Ordered, Application, Adjustment (applied by PACU staff)      Carin Primrose 02/07/2023, 12:55 PM

## 2023-02-07 NOTE — Anesthesia Preprocedure Evaluation (Signed)
Anesthesia Evaluation  Patient identified by MRN, date of birth, ID band Patient awake    Reviewed: Allergy & Precautions, H&P , NPO status , Patient's Chart, lab work & pertinent test results  Airway Mallampati: II  TM Distance: >3 FB Neck ROM: Full    Dental no notable dental hx.    Pulmonary neg pulmonary ROS   Pulmonary exam normal breath sounds clear to auscultation       Cardiovascular hypertension, Pt. on medications negative cardio ROS Normal cardiovascular exam Rhythm:Regular Rate:Normal     Neuro/Psych  Headaches  negative psych ROS   GI/Hepatic Neg liver ROS,GERD  ,,  Endo/Other  negative endocrine ROS    Renal/GU negative Renal ROS  negative genitourinary   Musculoskeletal  (+) Arthritis , Osteoarthritis,    Abdominal   Peds negative pediatric ROS (+)  Hematology negative hematology ROS (+)   Anesthesia Other Findings   Reproductive/Obstetrics negative OB ROS                             Anesthesia Physical Anesthesia Plan  ASA: 2  Anesthesia Plan: General   Post-op Pain Management: Dilaudid IV   Induction: Intravenous  PONV Risk Score and Plan: 2 and Ondansetron, Midazolam and Treatment may vary due to age or medical condition  Airway Management Planned: LMA  Additional Equipment:   Intra-op Plan:   Post-operative Plan: Extubation in OR  Informed Consent: I have reviewed the patients History and Physical, chart, labs and discussed the procedure including the risks, benefits and alternatives for the proposed anesthesia with the patient or authorized representative who has indicated his/her understanding and acceptance.     Dental advisory given  Plan Discussed with: CRNA  Anesthesia Plan Comments:        Anesthesia Quick Evaluation

## 2023-02-07 NOTE — H&P (Signed)
Gary Cole is an 67 y.o. male.   Chief Complaint: Pain base of the first metatarsal left foot HPI: Patient is a 67 year old gentleman who presents in follow-up for left foot pain. His last examination he had an injection of the third webspace left foot with neuroma type symptoms. Patient states the injection helped with swelling but did not help with pain. 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   Past Medical History:  Diagnosis Date   GERD (gastroesophageal reflux disease)    Headache    Hypertension    Rheumatoid arthritis ([place])     Past Surgical History:  Procedure Laterality Date   ANKLE SURGERY Left    CARPAL TUNNEL RELEASE Left    CHOLECYSTECTOMY  2008   COLONOSCOPY N/A 04/12/2016   Dr. Arnoldo Morale: normal.    CYST EXCISION     neck   ESOPHAGOGASTRODUODENOSCOPY N/A 07/29/2018   Dr. Gala Romney: severe erosive/ulcerative esophagitis s/p diltaation, small hiatal hernia, normal duodenum   KNEE SURGERY Right    MALONEY DILATION N/A 07/29/2018   Procedure: Venia Minks DILATION;  Surgeon: Daneil Dolin, MD;  [facility];  Service: Endoscopy;  Laterality: N/A;    Family History  Problem Relation Age of Onset   Kidney cancer Father    Alcohol abuse Father    Colon cancer Neg Hx    Celiac disease Neg Hx    Inflammatory bowel disease Neg Hx    Colon polyps Neg Hx    Social History:  reports that he has never smoked. He has never used smokeless tobacco. He reports that he does not drink alcohol and does not use drugs.  Allergies: No Known Allergies  Medications Prior to Admission  Medication Sig Dispense Refill   etanercept (ENBREL SURECLICK) 50 MG/ML injection Inject 50 mg into the skin every Wednesday.     folic acid (FOLVITE) A999333 MCG tablet Take 400 mcg by mouth daily before lunch.     methotrexate 2.5 MG tablet  Take 10-12.5 mg by mouth See admin instructions. Take 5 tablets (12.5 mg) by mouth every Tuesday & take 4 tablets (10 mg) by mouth every Thursday  3   naproxen sodium (ALEVE) 220 MG tablet Take 220 mg by mouth 2 (two) times daily as needed (pain.).     olmesartan (BENICAR) 40 MG tablet Take 40 mg by mouth daily before lunch.     pantoprazole (PROTONIX) 40 MG tablet TAKE 1 TABLET BY MOUTH ONCE DAILY BEFORE BREAKFAST 90 tablet 3   predniSONE (DELTASONE) 5 MG tablet Take 5 mg by mouth See admin instructions. Take 1 tablet (5 mg) by mouth twice weekly on Tuesdays & Thursdays.  2   Saw Palmetto 450 MG CAPS Take 450 mg by mouth daily before lunch.     SUMAtriptan (IMITREX) 100 MG tablet Take 100 mg by mouth every 2 (two) hours as needed for migraine. For migraines  0    Results for orders placed or performed during the hospital encounter of 02/07/23 (from the past 48 hour(s))  CBC per protocol     Status: None   Collection Time: 02/07/23  6:05 AM  Result Value Ref Range   WBC 4.1 4.0 - 10.5 K/uL   RBC 4.56 4.22 - 5.81 MIL/uL   Hemoglobin 15.0 13.0 - 17.0 g/dL   HCT 44.7  39.0 - 52.0 %   MCV 98.0 80.0 - 100.0 fL   MCH 32.9 26.0 - 34.0 pg   MCHC 33.6 30.0 - 36.0 g/dL   RDW 13.6 11.5 - 15.5 %   Platelets 151 150 - 400 K/uL   nRBC 0.0 0.0 - 0.2 %    Comment: Performed at [facility], [place] [address], [place], [place] [id number]  Basic metabolic panel per protocol     Status: None   Collection Time: 02/07/23  6:05 AM  Result Value Ref Range   Sodium 141 135 - 145 mmol/L   Potassium 3.7 3.5 - 5.1 mmol/L   Chloride 105 98 - 111 mmol/L   CO2 26 22 - 32 mmol/L   Glucose, Bld 97 70 - 99 mg/dL    Comment: Glucose reference range applies only to samples taken after fasting for at least 8 hours.   BUN 12 8 - 23 mg/dL   Creatinine, Ser 1.15 0.61 - 1.24 mg/dL   Calcium 9.1 8.9 - 10.3 mg/dL   GFR, Estimated >60 >60 mL/min    Comment: (NOTE) Calculated using the CKD-EPI Creatinine Equation  (2021)    Anion gap 10 5 - 15    Comment: Performed at [facility] [address], [place], [place] [address]   No results found.  Review of Systems  All other systems reviewed and are negative.   Blood pressure 128/82, pulse 65, temperature 98.1 F (36.7 C), temperature source Oral, resp. rate 17, height 5' 10"$  (1.778 m), weight 72.6 kg, SpO2 98 %. Physical Exam  Patient is alert, oriented, no adenopathy, well-dressed, normal affect, normal respiratory effort. Examination patient has a palpable dorsalis pedis pulse he has pain to palpation over the prominent screw and painful bony spurs to palpation of the base of the first metatarsal medial cuneiform joint.  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  Patient also has pain to palpation of the plantar aspect of the lateral metatarsals.  This may be overloading from the short first metatarsal.  He does have good dorsiflexion of the ankle of 20 degrees. Assessment/Plan 1. 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  Risk and benefits were discussed including persistent pain over the lateral side of his foot potential for additional surgery.  Patient states he understands wished to proceed with surgery at this time.  Newt Minion, MD 02/07/2023, 6:47 AM

## 2023-02-07 NOTE — Op Note (Signed)
02/07/2023  9:46 AM  PATIENT:  Gary Cole    PRE-OPERATIVE DIAGNOSIS:  Non Union Fusion Left Foot  POST-OPERATIVE DIAGNOSIS:  Same  PROCEDURE:  LEFT FOOT REMOVAL OF DEEP HARDWARE REVISION FUSION BASE 1ST METATARSAL C-arm fluoroscopy to verify reduction.  SURGEON:  Newt Minion, MD  PHYSICIAN ASSISTANT:None ANESTHESIA:   General  PREOPERATIVE INDICATIONS:  Gary Cole is a  67 y.o. male with a diagnosis of Non Union Fusion Left Foot who failed conservative measures and elected for surgical management.    The risks benefits and alternatives were discussed with the patient preoperatively including but not limited to the risks of infection, bleeding, nerve injury, cardiopulmonary complications, the need for revision surgery, among others, and the patient was willing to proceed.  OPERATIVE IMPLANTS: 40 mm headless cannulated screw and 16 mm staple.  @ENCIMAGES$ @  OPERATIVE FINDINGS: See arthroscopy verified reduction base of the first metatarsal medial cuneiform.  OPERATIVE PROCEDURE: Patient was brought to the operating room after undergoing a regional anesthetic.  After adequate levels anesthesia were obtained patient's left lower extremity was prepped using DuraPrep draped into a sterile field a timeout was called.  The previous dorsal incision was used this was carried down to the base of the first metatarsal medial cuneiform.  The EHL tendon was protected and the anterior tibial tendon were protected.  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  A oscillating saw was used to resect the dorsal osteophytic bone spurs.  The saw was then used to further resect the fibrous union from the base of the first metatarsal medial cuneiform.  The curette was used for further debridement.  The wound was irrigated with normal saline and the fibrous nonunion was completely free.  The deep retained small frag screw was  removed without complications.  A guidewire was placed from the base for first metatarsal to the medial cuneiform.  C-arm possibly verified alignment.  The joint space was packed with 2 cc of stay graft.  This was then compressed with the 40 mm headless compression screw.  This was then reinforced with a staple placed medially 16 mm to further stabilize the joint.  See arthroscopy verified reduction.  The wound was irrigated with normal saline incision closed using 2-0 nylon and a sterile dressing was applied.  Patient was taken the PACU in stable condition.   DISCHARGE PLANNING:  Antibiotic duration: Preoperative antibiotics  Weightbearing: Touchdown weightbearing on the left  Pain medication: Prescription for Percocet  Dressing care/ Wound VAC: Dry dressing follow-up in the office 1 week  Ambulatory devices: Crutches and Cam boot walker  Discharge to: Home.  Follow-up: In the office 1 week post operative.

## 2023-02-07 NOTE — Anesthesia Postprocedure Evaluation (Signed)
Anesthesia Post Note  Patient: Gary Cole  Procedure(s) Performed: LEFT FOOT REMOVAL OF HARDWARE AND REVISION FUSION BASE 1ST METATARSAL (Left: Foot)     Patient location during evaluation: PACU Anesthesia Type: General Level of consciousness: awake and alert Pain management: pain level controlled Vital Signs Assessment: post-procedure vital signs reviewed and stable Respiratory status: spontaneous breathing, nonlabored ventilation and respiratory function stable Cardiovascular status: blood pressure returned to baseline and stable Postop Assessment: no apparent nausea or vomiting Anesthetic complications: no   No notable events documented.  Last Vitals:  Vitals:   02/07/23 1000 02/07/23 1015  BP: 117/65 105/61  Pulse: 83 69  Resp: 19 14  Temp:    SpO2: 95% 97%    Last Pain:  Vitals:   02/07/23 0945  TempSrc:   PainSc: [place]

## 2023-02-07 NOTE — Transfer of Care (Signed)
Immediate Anesthesia Transfer of Care Note  Patient: Gary Cole  Procedure(s) Performed: LEFT FOOT REMOVAL OF HARDWARE AND REVISION FUSION BASE 1ST METATARSAL (Left: Foot)  Patient Location: PACU  Anesthesia Type:General  Level of Consciousness: awake, alert , and oriented  Airway & Oxygen Therapy: Patient Spontanous Breathing and Patient connected to face mask oxygen  Post-op Assessment: Report given to RN and Post -op Vital signs reviewed and stable  Post vital signs: Reviewed and stable  Last Vitals:  Vitals Value Taken Time  BP 121/62 02/07/23 0917  Temp 36.5 C 02/07/23 0917  Pulse 69 02/07/23 0922  Resp 17 02/07/23 0922  SpO2 99 % 02/07/23 0922  Vitals shown include unvalidated device data.  Last Pain:  Vitals:   02/07/23 0917  TempSrc:   PainSc: Asleep         Complications: No notable events documented.

## 2023-02-07 NOTE — Anesthesia Procedure Notes (Signed)
Procedure Name: MAC Date/Time: 02/07/2023 8:15 AM  Performed by: Griffin Dakin, CRNAPre-anesthesia Checklist: Patient identified, Emergency Drugs available, Suction available, Patient being monitored and Timeout performed Patient Re-evaluated:Patient Re-evaluated prior to induction Oxygen Delivery Method: Circle system utilized Preoxygenation: Pre-oxygenation with 100% oxygen Induction Type: IV induction LMA: LMA inserted LMA Size: 5.0 Number of attempts: 1 Placement Confirmation: positive ETCO2 and breath sounds checked- equal and bilateral Tube secured with: Tape Dental Injury: Teeth and Oropharynx as per pre-operative assessment

## 2023-02-10 ENCOUNTER — Encounter (HOSPITAL_COMMUNITY): Payer: Self-pay | Admitting: Orthopedic Surgery

## 2023-02-14 ENCOUNTER — Encounter: Payer: Self-pay | Admitting: Family

## 2023-02-14 ENCOUNTER — Ambulatory Visit (INDEPENDENT_AMBULATORY_CARE_PROVIDER_SITE_OTHER): Payer: Medicare Other | Admitting: Family

## 2023-02-14 DIAGNOSIS — M9689 Other intraoperative and postprocedural complications and disorders of the musculoskeletal system: Secondary | ICD-10-CM

## 2023-02-14 NOTE — Progress Notes (Signed)
   Post-Op Visit Note   Patient: Gary Cole           Date of Birth: July 10, 1956           MRN: [id number] Visit Date: 02/14/2023 PCP: Sharilyn Sites, MD  Chief Complaint: No chief complaint on file.   HPI:  HPI The patient is a 67 year old gentleman seen status post left foot removal hardware and revision fusion base of first MT.  Ortho Exam On examination of the left foot the incision is well-approximated with sutures there is no gaping or drainage  Visit Diagnoses: No diagnosis found.  Plan: Begin daily Dial soap cleansing.  Continue cam walker.  Continue nonweightbearing follow-up in the office in 1 week with radiographs consider suture removal  Follow-Up Instructions: No follow-ups on file.   Imaging: No results found.  Orders:  No orders of the defined types were placed in this encounter.  No orders of the defined types were placed in this encounter.    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   GERD (gastroesophageal reflux disease) 02/03/2019   RUQ pain 06/22/2018   Early satiety 06/22/2018   Diarrhea 06/22/2018   Abnormal CT scan, esophagus 06/22/2018   Esophageal dysphagia 06/22/2018   Past Medical History:  Diagnosis Date   GERD (gastroesophageal reflux disease)    Headache    Hypertension    Rheumatoid arthritis ([place])     Family History  Problem Relation Age of Onset   Kidney cancer Father    Alcohol abuse Father    Colon cancer Neg Hx    Celiac disease Neg Hx    Inflammatory bowel disease Neg Hx    Colon polyps Neg Hx     Past Surgical History:  Procedure Laterality Date   ANKLE SURGERY Left    CARPAL TUNNEL RELEASE Left    CHOLECYSTECTOMY  2008   COLONOSCOPY N/A 04/12/2016   Dr. Arnoldo Morale: normal.    CYST EXCISION     neck   ESOPHAGOGASTRODUODENOSCOPY N/A 07/29/2018   Dr. Gala Romney: severe erosive/ulcerative esophagitis s/p diltaation, small hiatal hernia, normal duodenum    FOOT ARTHRODESIS Left 02/07/2023   Procedure: LEFT FOOT REMOVAL OF HARDWARE AND REVISION FUSION BASE 1ST METATARSAL;  Surgeon: Newt Minion, MD;  Location: [facility];  Service: Orthopedics;  Laterality: Left;   KNEE SURGERY Right    MALONEY DILATION N/A 07/29/2018   Procedure: Venia Minks DILATION;  Surgeon: Daneil Dolin, MD;  [facility];  Service: Endoscopy;  Laterality: N/A;   Social History   Occupational History   Not on file  Tobacco Use   Smoking status: Never   Smokeless tobacco: Never  Vaping Use   Vaping Use: Never used  Substance and Sexual Activity   Alcohol use: No   Drug use: No   Sexual activity: Not on file

## 2023-02-21 ENCOUNTER — Ambulatory Visit (INDEPENDENT_AMBULATORY_CARE_PROVIDER_SITE_OTHER): Payer: Medicare Other | Admitting: Family

## 2023-02-21 ENCOUNTER — Ambulatory Visit (INDEPENDENT_AMBULATORY_CARE_PROVIDER_SITE_OTHER): Payer: Medicare Other

## 2023-02-21 ENCOUNTER — Encounter: Payer: Self-pay | Admitting: Family

## 2023-02-21 DIAGNOSIS — M9689 Other intraoperative and postprocedural complications and disorders of the musculoskeletal system: Secondary | ICD-10-CM | POA: Diagnosis not present

## 2023-02-21 NOTE — Progress Notes (Signed)
   Post-Op Visit Note   Patient: Gary Cole           Date of Birth: 1956/06/04           MRN: [id number] Visit Date: 02/21/2023 PCP: Sharilyn Sites, MD  Chief Complaint:  Chief Complaint  Patient presents with   Left Foot - Routine Post Op    02/07/23 left foot removal of hardware and revision fusion base of first MT    HPI:  HPI The patient is 67 year old gentleman seen status post left foot removal of hardware with revision fusion base of first metatarsal he has been in a cam walker nonweightbearing for the last 2 weeks sutures in place Ortho Exam On examination of the left foot his incision is well-healed sutures in place no gaping drainage or erythema Visit Diagnoses:  1. 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  He may continue daily Dial soap cleansing dry dressings continue CAM Walker nonweightbearing with crutches  Follow-Up Instructions: No follow-ups on file.   Imaging: No results found.  Orders:  Orders Placed This Encounter  Procedures   XR Foot Complete Left   No orders of the defined types were placed in this encounter.    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   GERD (gastroesophageal reflux disease) 02/03/2019   RUQ pain 06/22/2018   Early satiety 06/22/2018   Diarrhea 06/22/2018   Abnormal CT scan, esophagus 06/22/2018   Esophageal dysphagia 06/22/2018   Past Medical History:  Diagnosis Date   GERD (gastroesophageal reflux disease)    Headache    Hypertension    Rheumatoid arthritis ([place])     Family History  Problem Relation Age of Onset   Kidney cancer Father    Alcohol abuse Father    Colon cancer Neg Hx    Celiac disease Neg Hx    Inflammatory bowel disease Neg Hx    Colon polyps Neg Hx     Past Surgical History:  Procedure Laterality Date   ANKLE SURGERY Left    CARPAL TUNNEL RELEASE Left    CHOLECYSTECTOMY  2008   COLONOSCOPY N/A  04/12/2016   Dr. Arnoldo Morale: normal.    CYST EXCISION     neck   ESOPHAGOGASTRODUODENOSCOPY N/A 07/29/2018   Dr. Gala Romney: severe erosive/ulcerative esophagitis s/p diltaation, small hiatal hernia, normal duodenum   FOOT ARTHRODESIS Left 02/07/2023   Procedure: LEFT FOOT REMOVAL OF HARDWARE AND REVISION FUSION BASE 1ST METATARSAL;  Surgeon: Newt Minion, MD;  Location: [facility];  Service: Orthopedics;  Laterality: Left;   KNEE SURGERY Right    MALONEY DILATION N/A 07/29/2018   Procedure: Venia Minks DILATION;  Surgeon: Daneil Dolin, MD;  [facility];  Service: Endoscopy;  Laterality: N/A;   Social History   Occupational History   Not on file  Tobacco Use   Smoking status: Never   Smokeless tobacco: Never  Vaping Use   Vaping Use: Never used  Substance and Sexual Activity   Alcohol use: No   Drug use: No   Sexual activity: Not on file

## 2023-03-07 DIAGNOSIS — C4491 Basal cell carcinoma of skin, unspecified: Secondary | ICD-10-CM | POA: Diagnosis not present

## 2023-03-07 DIAGNOSIS — R7309 Other abnormal glucose: Secondary | ICD-10-CM | POA: Diagnosis not present

## 2023-03-07 DIAGNOSIS — E039 Hypothyroidism, unspecified: Secondary | ICD-10-CM | POA: Diagnosis not present

## 2023-03-07 DIAGNOSIS — Z6824 Body mass index (BMI) 24.0-24.9, adult: Secondary | ICD-10-CM | POA: Diagnosis not present

## 2023-03-07 DIAGNOSIS — I10 Essential (primary) hypertension: Secondary | ICD-10-CM | POA: Diagnosis not present

## 2023-03-07 DIAGNOSIS — Z0001 Encounter for general adult medical examination with abnormal findings: Secondary | ICD-10-CM | POA: Diagnosis not present

## 2023-03-07 DIAGNOSIS — Z9889 Other specified postprocedural states: Secondary | ICD-10-CM | POA: Diagnosis not present

## 2023-03-07 DIAGNOSIS — M069 Rheumatoid arthritis, unspecified: Secondary | ICD-10-CM | POA: Diagnosis not present

## 2023-03-10 ENCOUNTER — Encounter: Payer: Self-pay | Admitting: Orthopedic Surgery

## 2023-03-10 ENCOUNTER — Ambulatory Visit (INDEPENDENT_AMBULATORY_CARE_PROVIDER_SITE_OTHER): Payer: Medicare Other | Admitting: Orthopedic Surgery

## 2023-03-10 DIAGNOSIS — M9689 Other intraoperative and postprocedural complications and disorders of the musculoskeletal system: Secondary | ICD-10-CM

## 2023-03-10 MED ORDER — GABAPENTIN 100 MG PO CAPS: 100.0000 mg | ORAL_CAPSULE | Freq: Three times a day (TID) | ORAL | 0 refills | Status: DC

## 2023-03-10 NOTE — Progress Notes (Signed)
Office Visit Note   Patient: Gary Cole           Date of Birth: March 21, 1956           MRN: [id number] Visit Date: 03/10/2023              Requested by: Sharilyn Sites, [address],  [place] [address] PCP: Sharilyn Sites, MD  Chief Complaint  Patient presents with   Left Foot - Routine Post Op    02/07/2023 left foot HDW removal and revision fusion base of 1st MT       HPI: Patient is a 67 year old gentleman is 4 weeks status post revision fusion left foot base of the first metatarsal.  Patient is currently nonweightbearing with a cam walker and crutches.  Patient states he has generalized numbness on the dorsum and plantar aspect of his foot and occasionally a shocking electrical pain in the first webspace.  Assessment & Plan: Visit Diagnoses:  1. 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  Patient will increase his weightbearing as tolerated with the cam walker and crutches.  Three-view radiographs of the left foot at follow-up.  Follow-Up Instructions: Return in about 2 weeks (around 03/24/2023).   Ortho Exam  Patient is alert, oriented, no adenopathy, well-dressed, normal affect, normal respiratory effort. Examination there is minimal swelling patient has decreased sensation globally on the plantar and dorsal aspect of his foot.  Occasionally electrical sharp shooting pain into the first webspace.  Patient has no hypersensitivity to light touch no signs of a dystrophy.  Imaging: No results found. No images are attached to the encounter.  Labs: No results found for: "HGBA1C", "ESRSEDRATE", "CRP", "LABURIC", "REPTSTATUS", "GRAMSTAIN", "CULT", "LABORGA"   Lab Results  Component Value Date   ALBUMIN 4.3 06/22/2018    No results found for: "MG" No results found for: "VD25OH"  No results found for: "PREALBUMIN"    Latest Ref Rng & Units 02/07/2023    6:05 AM 06/22/2018    3:11 PM  CBC  EXTENDED  WBC 4.0 - 10.5 K/uL 4.1  5.6   RBC 4.22 - 5.81 MIL/uL 4.56  4.40   Hemoglobin 13.0 - 17.0 g/dL 15.0  13.7   HCT 39.0 - 52.0 % 44.7  42.5   Platelets 150 - 400 K/uL 151  206   NEUT# 1.4 - 7.0 x10E3/uL  3.6   Lymph# 0.7 - 3.1 x10E3/uL  1.8      There is no height or weight on file to calculate BMI.  Orders:  No orders of the defined types were placed in this encounter.  Meds ordered this encounter  Medications   gabapentin (NEURONTIN) 100 MG capsule    Sig: Take 1 capsule (100 mg total) by mouth 3 (three) times daily. When necessary for neuropathy pain    Dispense:  90 capsule    Refill:  0     Procedures: No procedures performed  Clinical Data: No additional findings.  ROS:  All other systems negative, except as noted in the HPI. Review of Systems  Objective: Vital Signs: There were no vitals taken for this visit.  Specialty Comments:  No specialty comments available.  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   GERD (gastroesophageal reflux disease) 02/03/2019   RUQ pain 06/22/2018   Early satiety 06/22/2018  Diarrhea 06/22/2018   Abnormal CT scan, esophagus 06/22/2018   Esophageal dysphagia 06/22/2018   Past Medical History:  Diagnosis Date   GERD (gastroesophageal reflux disease)    Headache    Hypertension    Rheumatoid arthritis ([place])     Family History  Problem Relation Age of Onset   Kidney cancer Father    Alcohol abuse Father    Colon cancer Neg Hx    Celiac disease Neg Hx    Inflammatory bowel disease Neg Hx    Colon polyps Neg Hx     Past Surgical History:  Procedure Laterality Date   ANKLE SURGERY Left    CARPAL TUNNEL RELEASE Left    CHOLECYSTECTOMY  2008   COLONOSCOPY N/A 04/12/2016   Dr. Arnoldo Morale: normal.    CYST EXCISION     neck   ESOPHAGOGASTRODUODENOSCOPY N/A 07/29/2018   Dr. Gala Romney: severe erosive/ulcerative esophagitis s/p diltaation, small hiatal  hernia, normal duodenum   FOOT ARTHRODESIS Left 02/07/2023   Procedure: LEFT FOOT REMOVAL OF HARDWARE AND REVISION FUSION BASE 1ST METATARSAL;  Surgeon: Newt Minion, MD;  Location: [facility];  Service: Orthopedics;  Laterality: Left;   KNEE SURGERY Right    MALONEY DILATION N/A 07/29/2018   Procedure: Venia Minks DILATION;  Surgeon: Daneil Dolin, MD;  [facility];  Service: Endoscopy;  Laterality: N/A;   Social History   Occupational History   Not on file  Tobacco Use   Smoking status: Never   Smokeless tobacco: Never  Vaping Use   Vaping Use: Never used  Substance and Sexual Activity   Alcohol use: No   Drug use: No   Sexual activity: Not on file

## 2023-03-25 ENCOUNTER — Encounter: Payer: Self-pay | Admitting: Gastroenterology

## 2023-03-31 ENCOUNTER — Encounter: Payer: Self-pay | Admitting: Orthopedic Surgery

## 2023-03-31 ENCOUNTER — Ambulatory Visit (INDEPENDENT_AMBULATORY_CARE_PROVIDER_SITE_OTHER): Payer: Medicare Other | Admitting: Orthopedic Surgery

## 2023-03-31 DIAGNOSIS — M9689 Other intraoperative and postprocedural complications and disorders of the musculoskeletal system: Secondary | ICD-10-CM

## 2023-03-31 NOTE — Progress Notes (Signed)
Office Visit Note   Patient: Gary Cole           Date of Birth: 12/02/1956           MRN: [id number] Visit Date: 03/31/2023              Requested by: Assunta Found, MD [address] [place],  [place] [address] PCP: Assunta Found, MD  Chief Complaint  Patient presents with   Left Foot - Routine Post Op    02/07/2023 left foot HDW removal and revision fusion base 1st MT       HPI: Patient is a 67 year old gentleman who presents status post revision fusion base of the first metatarsal left foot.  Patient has been on Neurontin 100 mg 3 times a day.  Patient states the hypersensitivity is improving.  He is increasing his weightbearing with the cam walker and crutches.  He is 2 months out.  Assessment & Plan: Visit Diagnoses:  1. 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  Three-view radiographs of the left foot at follow-up.  Follow-Up Instructions: No follow-ups on file.   Ortho Exam  Patient is alert, oriented, no adenopathy, well-dressed, normal affect, normal respiratory effort. Examination there is minimal swelling of the incision is well-healed.  Patient does have a little hypersensitivity to palpation distally.  No pain to palpation over the fusion site.  Imaging: No results found. No images are attached to the encounter.  Labs: No results found for: "HGBA1C", "ESRSEDRATE", "CRP", "LABURIC", "REPTSTATUS", "GRAMSTAIN", "CULT", "LABORGA"   Lab Results  Component Value Date   ALBUMIN 4.3 06/22/2018    No results found for: "MG" No results found for: "VD25OH"  No results found for: "PREALBUMIN"    Latest Ref Rng & Units 02/07/2023    6:05 AM 06/22/2018    3:11 PM  CBC EXTENDED  WBC 4.0 - 10.5 K/uL 4.1  5.6   RBC 4.22 - 5.81 MIL/uL 4.56  4.40   Hemoglobin 13.0 - 17.0 g/dL 58.8  50.2   HCT  77.4 - 52.0 % 44.7  42.5   Platelets 150 - 400 K/uL 151  206   NEUT# 1.4 - 7.0 x10E3/uL  3.6   Lymph# 0.7 - 3.1 x10E3/uL  1.8      There is no height or weight on file to calculate BMI.  Orders:  No orders of the defined types were placed in this encounter.  No orders of the defined types were placed in this encounter.    Procedures: No procedures performed  Clinical Data: No additional findings.  ROS:  All other systems negative, except as noted in the HPI. Review of Systems  Objective: Vital Signs: There were no vitals taken for this visit.  Specialty Comments:  No specialty comments available.  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   GERD (gastroesophageal reflux disease) 02/03/2019   RUQ pain 06/22/2018   Early satiety 06/22/2018   Diarrhea 06/22/2018   Abnormal CT scan, esophagus 06/22/2018   Esophageal dysphagia 06/22/2018   Past Medical History:  Diagnosis Date   GERD (gastroesophageal reflux disease)    Headache    Hypertension    Rheumatoid arthritis     Family History  Problem Relation Age of Onset   Kidney cancer Father  Alcohol abuse Father    Colon cancer Neg Hx    Celiac disease Neg Hx    Inflammatory bowel disease Neg Hx    Colon polyps Neg Hx     Past Surgical History:  Procedure Laterality Date   ANKLE SURGERY Left    CARPAL TUNNEL RELEASE Left    CHOLECYSTECTOMY  2008   COLONOSCOPY N/A 04/12/2016   Dr. Lovell Sheehan: normal.    CYST EXCISION     neck   ESOPHAGOGASTRODUODENOSCOPY N/A 07/29/2018   Dr. Jena Gauss: severe erosive/ulcerative esophagitis s/p diltaation, small hiatal hernia, normal duodenum   FOOT ARTHRODESIS Left 02/07/2023   Procedure: LEFT FOOT REMOVAL OF HARDWARE AND REVISION FUSION BASE 1ST METATARSAL;  Surgeon: Nadara Mustard, MD;  Location: [facility];  Service: Orthopedics;  Laterality: Left;   KNEE SURGERY Right    MALONEY DILATION N/A 07/29/2018    Procedure: Elease Hashimoto DILATION;  Surgeon: Corbin Ade, MD;  Location: [facility];  Service: Endoscopy;  Laterality: N/A;   Social History   Occupational History   Not on file  Tobacco Use   Smoking status: Never   Smokeless tobacco: Never  Vaping Use   Vaping Use: Never used  Substance and Sexual Activity   Alcohol use: No   Drug use: No   Sexual activity: Not on file

## 2023-04-03 DIAGNOSIS — M7989 Other specified soft tissue disorders: Secondary | ICD-10-CM | POA: Diagnosis not present

## 2023-04-03 DIAGNOSIS — R5382 Chronic fatigue, unspecified: Secondary | ICD-10-CM | POA: Diagnosis not present

## 2023-04-03 DIAGNOSIS — M0609 Rheumatoid arthritis without rheumatoid factor, multiple sites: Secondary | ICD-10-CM | POA: Diagnosis not present

## 2023-04-08 ENCOUNTER — Encounter: Payer: Self-pay | Admitting: Family

## 2023-04-08 ENCOUNTER — Ambulatory Visit (INDEPENDENT_AMBULATORY_CARE_PROVIDER_SITE_OTHER): Payer: Medicare Other | Admitting: Family

## 2023-04-08 ENCOUNTER — Other Ambulatory Visit (INDEPENDENT_AMBULATORY_CARE_PROVIDER_SITE_OTHER): Payer: Medicare Other

## 2023-04-08 DIAGNOSIS — M9689 Other intraoperative and postprocedural complications and disorders of the musculoskeletal system: Secondary | ICD-10-CM

## 2023-04-08 NOTE — Progress Notes (Signed)
   Post-Op Visit Note   Patient: Gary Cole           Date of Birth: [birth year]           MRN: [id number] Visit Date: 04/08/2023 PCP: Assunta Found, MD  Chief Complaint: No chief complaint on file.   HPI:  HPI The patient is a 67 year old gentleman seen status post revision fusion base of the first metatarsal of his left foot.  He states he has resumed regular shoewear but has been having difficulty with pain since resuming regular shoewear. Ortho Exam On examination of the left foot he does have minimal edema the incision is well-healed there is no hypersensitivity to light touch no pain with palpation over the fusion site  Visit Diagnoses: No diagnosis found.  Plan: Discussed resuming his cam walker.  He will weight-bear as tolerated in protective shoe wear follow-up in 4 weeks  Follow-Up Instructions: No follow-ups on file.   Imaging: No results found.  Orders:  No orders of the defined types were placed in this encounter.  No orders of the defined types were placed in this encounter.    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   GERD (gastroesophageal reflux disease) 02/03/2019   RUQ pain 06/22/2018   Early satiety 06/22/2018   Diarrhea 06/22/2018   Abnormal CT scan, esophagus 06/22/2018   Esophageal dysphagia 06/22/2018   Past Medical History:  Diagnosis Date   GERD (gastroesophageal reflux disease)    Headache    Hypertension    Rheumatoid arthritis     Family History  Problem Relation Age of Onset   Kidney cancer Father    Alcohol abuse Father    Colon cancer Neg Hx    Celiac disease Neg Hx    Inflammatory bowel disease Neg Hx    Colon polyps Neg Hx     Past Surgical History:  Procedure Laterality Date   ANKLE SURGERY Left    CARPAL TUNNEL RELEASE Left    CHOLECYSTECTOMY  2008   COLONOSCOPY N/A 04/12/2016   Dr. Lovell Sheehan: normal.    CYST EXCISION     neck    ESOPHAGOGASTRODUODENOSCOPY N/A 07/29/2018   Dr. Jena Gauss: severe erosive/ulcerative esophagitis s/p diltaation, small hiatal hernia, normal duodenum   FOOT ARTHRODESIS Left 02/07/2023   Procedure: LEFT FOOT REMOVAL OF HARDWARE AND REVISION FUSION BASE 1ST METATARSAL;  Surgeon: Nadara Mustard, MD;  Location: [facility];  Service: Orthopedics;  Laterality: Left;   KNEE SURGERY Right    MALONEY DILATION N/A 07/29/2018   Procedure: Elease Hashimoto DILATION;  Surgeon: Corbin Ade, MD;  Location: [facility];  Service: Endoscopy;  Laterality: N/A;   Social History   Occupational History   Not on file  Tobacco Use   Smoking status: Never   Smokeless tobacco: Never  Vaping Use   Vaping Use: Never used  Substance and Sexual Activity   Alcohol use: No   Drug use: No   Sexual activity: Not on file

## 2023-04-09 ENCOUNTER — Encounter: Payer: Self-pay | Admitting: Family

## 2023-04-14 DIAGNOSIS — C44519 Basal cell carcinoma of skin of other part of trunk: Secondary | ICD-10-CM | POA: Diagnosis not present

## 2023-04-14 DIAGNOSIS — L308 Other specified dermatitis: Secondary | ICD-10-CM | POA: Diagnosis not present

## 2023-04-14 DIAGNOSIS — L720 Epidermal cyst: Secondary | ICD-10-CM | POA: Diagnosis not present

## 2023-04-28 ENCOUNTER — Ambulatory Visit (INDEPENDENT_AMBULATORY_CARE_PROVIDER_SITE_OTHER): Payer: Medicare Other | Admitting: Orthopedic Surgery

## 2023-04-28 ENCOUNTER — Encounter: Payer: Self-pay | Admitting: Orthopedic Surgery

## 2023-04-28 DIAGNOSIS — M9689 Other intraoperative and postprocedural complications and disorders of the musculoskeletal system: Secondary | ICD-10-CM

## 2023-04-28 DIAGNOSIS — M79672 Pain in left foot: Secondary | ICD-10-CM

## 2023-05-15 ENCOUNTER — Encounter: Payer: Self-pay | Admitting: Orthopedic Surgery

## 2023-05-15 NOTE — Progress Notes (Signed)
Office Visit Note   Patient: Gary Cole           Date of Birth: [birth year]           MRN: [id number] Visit Date: 04/28/2023              Requested by: Assunta Found, MD [address] [place],  [place] [address] PCP: Assunta Found, MD  Chief Complaint  Patient presents with   Left Foot - Follow-up    Hardware removal and 1st MT fusion revision 02/07/2023      HPI: Patient is a 67 year old gentleman who is 39-month status post revision fusion base of the first metatarsal left foot.  Assessment & Plan: Visit Diagnoses:  1. 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 Pain in left foot     Plan: Patient will advance his activities as tolerated no restrictions  Follow-Up Instructions: Return if symptoms worsen or fail Cole improve.   Ortho Exam  Patient is alert, oriented, no adenopathy, well-dressed, normal affect, normal respiratory effort. Examination there is no pain Cole palpation over the fusion site the incision is well-healed there is mild swelling  Imaging: No results found. No images are attached Cole the encounter.  Labs: No results found for: "HGBA1C", "ESRSEDRATE", "CRP", "LABURIC", "REPTSTATUS", "GRAMSTAIN", "CULT", "LABORGA"   Lab Results  Component Value Date   ALBUMIN 4.3 06/22/2018    No results found for: "MG" No results found for: "VD25OH"  No results found for: "PREALBUMIN"    Latest Ref Rng & Units 02/07/2023    6:05 AM 06/22/2018    3:11 PM  CBC EXTENDED  WBC 4.0 - 10.5 K/uL 4.1  5.6   RBC 4.22 - 5.81 MIL/uL 4.56  4.40   Hemoglobin 13.0 - 17.0 g/dL 62.1  30.8   HCT 65.7 - 52.0 % 44.7  42.5   Platelets 150 - 400 K/uL 151  206   NEUT# 1.4 - 7.0 x10E3/uL  3.6   Lymph# 0.7 - 3.1 x10E3/uL  1.8      There is no height or weight on file Cole calculate BMI.  Orders:  No orders of the defined types were placed in this encounter.  No orders of the defined types were placed in this encounter.    Procedures: No procedures performed  Clinical  Data: No additional findings.  ROS:  All other systems negative, except as noted in the HPI. Review of Systems  Objective: Vital Signs: There were no vitals taken for this visit.  Specialty Comments:  No specialty comments available.  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   GERD (gastroesophageal reflux disease) 02/03/2019   RUQ pain 06/22/2018   Early satiety 06/22/2018   Diarrhea 06/22/2018   Abnormal CT scan, esophagus 06/22/2018   Esophageal dysphagia 06/22/2018   Past Medical History:  Diagnosis Date   GERD (gastroesophageal reflux disease)    Headache    Hypertension    Rheumatoid arthritis (HCC)     Family History  Problem Relation Age of Onset   Kidney cancer Father    Alcohol abuse Father    Colon cancer Neg Hx    Celiac disease Neg Hx    Inflammatory bowel disease Neg Hx    Colon polyps Neg Hx     Past Surgical History:  Procedure Laterality Date   ANKLE SURGERY Left    CARPAL TUNNEL RELEASE Left    CHOLECYSTECTOMY  2008  COLONOSCOPY N/A 04/12/2016   Dr. Lovell Sheehan: normal.    CYST EXCISION     neck   ESOPHAGOGASTRODUODENOSCOPY N/A 07/29/2018   Dr. Jena Gauss: severe erosive/ulcerative esophagitis s/p diltaation, small hiatal hernia, normal duodenum   FOOT ARTHRODESIS Left 02/07/2023   Procedure: LEFT FOOT REMOVAL OF HARDWARE AND REVISION FUSION BASE 1ST METATARSAL;  Surgeon: Nadara Mustard, MD;  Location: [facility];  Service: Orthopedics;  Laterality: Left;   KNEE SURGERY Right    MALONEY DILATION N/A 07/29/2018   Procedure: Elease Hashimoto DILATION;  Surgeon: Corbin Ade, MD;  [facility]: [facility];  Service: Endoscopy;  Laterality: N/A;   Social History   Occupational History   Not on file  Tobacco Use   Smoking status: Never   Smokeless tobacco: Never  Vaping Use   Vaping Use: Never used  Substance and Sexual Activity   Alcohol use: No   Drug use: No   Sexual activity: Not on file

## 2023-05-26 DIAGNOSIS — Z08 Encounter for follow-up examination after completed treatment for malignant neoplasm: Secondary | ICD-10-CM | POA: Diagnosis not present

## 2023-05-26 DIAGNOSIS — L720 Epidermal cyst: Secondary | ICD-10-CM | POA: Diagnosis not present

## 2023-05-26 DIAGNOSIS — Z85828 Personal history of other malignant neoplasm of skin: Secondary | ICD-10-CM | POA: Diagnosis not present

## 2023-05-27 ENCOUNTER — Ambulatory Visit: Payer: Medicare Other | Admitting: Gastroenterology

## 2023-05-27 ENCOUNTER — Encounter: Payer: Self-pay | Admitting: Gastroenterology

## 2023-05-27 ENCOUNTER — Ambulatory Visit: Payer: Medicare Other | Admitting: Internal Medicine

## 2023-05-27 ENCOUNTER — Ambulatory Visit (INDEPENDENT_AMBULATORY_CARE_PROVIDER_SITE_OTHER): Payer: Medicare Other | Admitting: Gastroenterology

## 2023-05-27 VITALS — BP 122/66 | HR 73 | Temp 97.8°F | Ht 70.0 in | Wt 164.5 lb

## 2023-05-27 DIAGNOSIS — K219 Gastro-esophageal reflux disease without esophagitis: Secondary | ICD-10-CM | POA: Diagnosis not present

## 2023-05-27 DIAGNOSIS — R1010 Upper abdominal pain, unspecified: Secondary | ICD-10-CM | POA: Diagnosis not present

## 2023-05-27 NOTE — Patient Instructions (Signed)
Continue pantoprazole 40 mg once daily, 30 minutes prior to breakfast.  Will follow-up in 6 months.  We will consider weaning your pantoprazole at that point.  Please call the office if you have any questions or concerns  It was a pleasure to see you today!  I want to create trusting relationships with patients. If you receive a survey regarding your visit,  I greatly appreciate you taking time to fill this out on paper or through your MyChart. I value your feedback.  Brooke Bonito, MSN, FNP-BC, AGACNP-BC [facility]

## 2023-05-27 NOTE — Progress Notes (Signed)
GI Office Note    Referring Provider: Assunta Found, MD Primary Care Physician:  Assunta Found, MD Primary Gastroenterologist: Gerrit Friends.Rourk, MD  Date:  05/27/2023  ID:  JAKENDRICK Gary Cole, DOB 1956-05-11, MRN [id number]   Chief Complaint   Chief Complaint  Patient presents with   Follow-up    Patient here today for a follow up. Patient says his diarrhea and abdominal pain have subsided. Patient on Pantoprazole 40 mg once per day and this controls his gerd symptoms. Patient denies any current gi issues.    History of Present Illness  TILTON CATA is a 67 y.o. male with a history of GERD, hiatal hernia, RA, HTN presenting today for follow up.   EGD August 2019: -Severe erosive/ulcerative esophagitis s/p dilation -Small hiatal hernia -Normal duodenum -No evidence of tumor, lymph node seen on recent CT possibly reactive -Began Protonix 40 mg twice daily -Consider repeat CT of GE junction in 3 months.  Colonoscopy April 2017: -Entire examined colon normal.  No specimens collected. -Repeat colonoscopy in 10 years.  Last seen by Dr. Jena Gauss 01/28/23.  Presented with a 1 week history of gas bloating as well as lower abdominal pain. Reportedly symptoms of abdominal pain have resolved.  Having 1 good stool daily.  No blood or melena. Denied any constipation or diarrhea.  History of complicated reflux esophagitis with stricture dilated previously.  No evidence of Barrett's esophagus.  Symptoms controlled with Protonix 40 mg daily.  Gas bloat symptoms nicely responded to fiber supplementation and probiotic.  Recently lost fianc to colon cancer.  Plan for average risk screening colonoscopy in 2027.  Advised to continue Protonix 40 mg daily 30 minutes before breakfast.  Advised we will check for stool occult if positive schedule colonoscopy now.  Fecal occult was negative.  Labs and February 2024 with stable hemoglobin and BMP.  Today:  Bloating and abdominal pain have been improved. Also  was having reflux previously and has been resolved. No issues with reflux. No issues with appetite. No constipation nor diarrhea. No melena or brbpr. On PPI once daily.   [name] passed away in 29-Oct-2022. Passed from colon cancer. They were together about 13 years.   Due for colonoscopy in 3 years. Never had polyps. No FH of colon cancer. Father passed of kidney cancer and had nephrectomy. He also was a heavy drinker.   Current Outpatient Medications  Medication Sig Dispense Refill   etanercept (ENBREL SURECLICK) 50 MG/ML injection Inject 50 mg into the skin every Wednesday.     folic acid (FOLVITE) 400 MCG tablet Take 400 mcg by mouth daily before lunch.     levothyroxine (SYNTHROID) 75 MCG tablet Take 75 mcg by mouth daily before breakfast.     methotrexate 2.5 MG tablet Take 10-12.5 mg by mouth See admin instructions. Take 5 tablets (12.5 mg) by mouth every Tuesday & take 4 tablets (10 mg) by mouth every Thursday  3   naproxen sodium (ALEVE) 220 MG tablet Take 220 mg by mouth 2 (two) times daily as needed (pain.).     olmesartan (BENICAR) 40 MG tablet Take 40 mg by mouth daily before lunch.     pantoprazole (PROTONIX) 40 MG tablet TAKE 1 TABLET BY MOUTH ONCE DAILY BEFORE BREAKFAST 90 tablet 3   predniSONE (DELTASONE) 5 MG tablet Take 5 mg by mouth See admin instructions. Take 1 tablet (5 mg) by mouth twice weekly on Tuesdays & Thursdays.  2   Saw Palmetto 450 MG CAPS Take  450 mg by mouth daily before lunch.     SUMAtriptan (IMITREX) 100 MG tablet Take 100 mg by mouth every 2 (two) hours as needed for migraine. For migraines  0   tamsulosin (FLOMAX) 0.4 MG CAPS capsule Take 0.4 mg by mouth daily.     No current facility-administered medications for this visit.    Past Medical History:  Diagnosis Date   GERD (gastroesophageal reflux disease)    Headache    Hypertension    Rheumatoid arthritis (HCC)     Past Surgical History:  Procedure Laterality Date   ANKLE SURGERY Left     CARPAL TUNNEL RELEASE Left    CHOLECYSTECTOMY  2008   COLONOSCOPY N/A 04/12/2016   Dr. Lovell Sheehan: normal.    CYST EXCISION     neck   ESOPHAGOGASTRODUODENOSCOPY N/A 07/29/2018   Dr. Jena Gauss: severe erosive/ulcerative esophagitis s/p diltaation, small hiatal hernia, normal duodenum   FOOT ARTHRODESIS Left 02/07/2023   Procedure: LEFT FOOT REMOVAL OF HARDWARE AND REVISION FUSION BASE 1ST METATARSAL;  Surgeon: Nadara Mustard, MD;  Location: [facility];  Service: Orthopedics;  Laterality: Left;   KNEE SURGERY Right    MALONEY DILATION N/A 07/29/2018   Procedure: Elease Hashimoto DILATION;  Surgeon: Corbin Ade, MD;  [facility];  Service: Endoscopy;  Laterality: N/A;    Family History  Problem Relation Age of Onset   Kidney cancer Father    Alcohol abuse Father    Colon cancer Neg Hx    Celiac disease Neg Hx    Inflammatory bowel disease Neg Hx    Colon polyps Neg Hx     Allergies as of 05/27/2023   (No Known Allergies)    Social History   Socioeconomic History   Marital status: Single    Spouse name: Not on file   Number of children: Not on file   Years of education: Not on file   Highest education level: Not on file  Occupational History   Not on file  Tobacco Use   Smoking status: Never   Smokeless tobacco: Never  Vaping Use   Vaping Use: Never used  Substance and Sexual Activity   Alcohol use: No   Drug use: No   Sexual activity: Not on file  Other Topics Concern   Not on file  Social History Narrative   Not on file   Social Determinants of Health   Financial Resource Strain: Not on file  Food Insecurity: Not on file  Transportation Needs: Not on file  Physical Activity: Not on file  Stress: Not on file  Social Connections: Not on file    Review of Systems   Gen: Denies fever, chills, anorexia. Denies fatigue, weakness, weight loss.  CV: Denies chest pain, palpitations, syncope, peripheral edema, and claudication. Resp: Denies dyspnea at rest, cough,  wheezing, coughing up blood, and pleurisy. GI: See HPI Derm: Denies rash, itching, dry skin Psych: Denies depression, anxiety, memory loss, confusion. No homicidal or suicidal ideation.  Heme: Denies bruising, bleeding, and enlarged lymph nodes.  Physical Exam   BP 122/66 (BP Location: Left Arm, Patient Position: Sitting, Cuff Size: Normal)   Pulse 73   Temp 97.8 F (36.6 C) (Temporal)   Ht 5\' 10"  (1.778 m)   Wt 164 lb 8 oz (74.6 kg)   BMI 23.60 kg/m   General:   Alert and oriented. No distress noted. Pleasant and cooperative.  Head:  Normocephalic and atraumatic. Eyes:  Conjuctiva clear without scleral icterus. Mouth:  Oral mucosa pink and moist. Good dentition. No lesions. Abdomen:  +BS, soft, non-tender and non-distended. No rebound or guarding. No HSM or masses noted. Rectal: deferred Msk:  Symmetrical without gross deformities. Normal posture. Extremities:  Without edema. Neurologic:  Alert and  oriented x4 Psych:  Alert and cooperative. Normal mood and affect.  Assessment  Gary Cole is a 67 y.o. male with a history of GERD, hiatal hernia, RA, HTN presenting today for follow up.   GERD, abdominal pain: Denies any GERD symptoms or abdominal pain since being on pantoprazole.  Denies any constipation, diarrhea, nausea, vomiting, lack of appetite, early satiety, or dysphagia.  For now we will continue pantoprazole 40 mg once daily.  Will consider weaning this in 6 months.  Due for colon cancer screening in 2027.  Recent Hemoccult negative in February.  PLAN   Continue pantorazole 40 mg once daily for now. Consider weaning in 6 months.  Due for colonoscopy in 2027. Follow up regarding GERD in 6 months.     Brooke Bonito, MSN, FNP-BC, AGACNP-BC [facility]

## 2023-06-16 DIAGNOSIS — L720 Epidermal cyst: Secondary | ICD-10-CM | POA: Diagnosis not present

## 2023-07-03 DIAGNOSIS — M0609 Rheumatoid arthritis without rheumatoid factor, multiple sites: Secondary | ICD-10-CM | POA: Diagnosis not present

## 2023-07-03 DIAGNOSIS — R5383 Other fatigue: Secondary | ICD-10-CM | POA: Diagnosis not present

## 2023-09-22 ENCOUNTER — Ambulatory Visit
Admission: EM | Admit: 2023-09-22 | Discharge: 2023-09-22 | Disposition: A | Discharge status: Home / Self Care | Payer: Medicare Other | Attending: Nurse Practitioner | Admitting: Nurse Practitioner

## 2023-09-22 DIAGNOSIS — Z1152 Encounter for screening for COVID-19: Secondary | ICD-10-CM | POA: Diagnosis not present

## 2023-09-22 DIAGNOSIS — J069 Acute upper respiratory infection, unspecified: Secondary | ICD-10-CM | POA: Diagnosis not present

## 2023-09-22 DIAGNOSIS — R509 Fever, unspecified: Secondary | ICD-10-CM | POA: Insufficient documentation

## 2023-09-22 DIAGNOSIS — Z20822 Contact with and (suspected) exposure to covid-19: Secondary | ICD-10-CM | POA: Diagnosis not present

## 2023-09-22 MED ORDER — BENZONATATE 100 MG PO CAPS: 100.0000 mg | ORAL_CAPSULE | Freq: Three times a day (TID) | ORAL | 0 refills | Status: DC | PRN

## 2023-09-22 NOTE — ED Provider Notes (Signed)
[facility]    CSN: [id number] Arrival date & time: 09/22/23  1352      History   Chief Complaint No chief complaint on file.   HPI Gary Cole is a 67 y.o. male.   Patient presents today with 3-day history of fever, Tmax 101 F, body aches and chills, congested cough, chest congestion, runny and stuffy nose, sore throat that is now improved, headache that is now improved, decreased appetite that is now improved, and fatigue.  No chest pain, shortness of breath, chest tightness, ear pain, abdominal pain, nausea/vomiting, or diarrhea no loss of taste or smell or known sick contacts.  Has been taking Zyrtec and Mucinex DM with improvement.  Home COVID-19 test was negative yesterday.    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 Lovell Sheehan: normal.    CYST EXCISION     neck   ESOPHAGOGASTRODUODENOSCOPY N/A 07/29/2018   Dr. Jena Gauss: severe erosive/ulcerative esophagitis s/p diltaation, small hiatal hernia, normal duodenum   FOOT ARTHRODESIS Left 02/07/2023   Procedure: LEFT FOOT REMOVAL OF HARDWARE AND REVISION FUSION BASE 1ST METATARSAL;  Surgeon: Nadara Mustard, MD;  Location: [facility];  Service: Orthopedics;  Laterality: Left;   KNEE SURGERY Right    MALONEY DILATION N/A 07/29/2018   Procedure: Elease Hashimoto DILATION;  Surgeon: Corbin Ade, MD;  Location: [facility];  Service: Endoscopy;  Laterality: N/A;        Home Medications    Prior to Admission medications   Medication Sig Start Date End Date Taking? Authorizing Provider  benzonatate (TESSALON) 100 MG capsule Take 1 capsule (100 mg total) by mouth 3 (three) times daily as needed for cough. Do not take with alcohol or while driving or operating heavy machinery.  May cause drowsiness. 09/22/23  Yes Valentino Nose, NP  etanercept (ENBREL SURECLICK) 50 MG/ML injection Inject 50 mg into the skin every Wednesday.    [provider]  folic acid (FOLVITE) 400 MCG tablet Take 400 mcg by mouth daily before lunch.    [provider]  levothyroxine (SYNTHROID) 75 MCG tablet Take 75 mcg by mouth daily before breakfast.    [provider]  methotrexate 2.5 MG tablet Take 10-12.5 mg by mouth See admin instructions. Take 5 tablets (12.5 mg) by mouth every Tuesday & take 4 tablets (10 mg) by mouth every Thursday 06/16/18   [provider]  naproxen sodium (ALEVE) 220 MG tablet Take 220 mg by mouth 2 (two) times daily as needed (pain.).    [provider]  olmesartan (BENICAR) 40 MG tablet Take 40 mg by mouth daily before lunch. 01/13/23   [provider]  pantoprazole (PROTONIX) 40 MG tablet TAKE 1 TABLET BY MOUTH ONCE DAILY BEFORE BREAKFAST 09/16/22   Rourk, Gerrit Friends, MD  predniSONE (DELTASONE) 5 MG tablet Take 5 mg by mouth See admin instructions.  Take 1 tablet (5 mg) by mouth twice weekly on Tuesdays & Thursdays. 05/03/18   [provider]  Saw Palmetto 450 MG CAPS Take 450 mg by mouth daily before lunch.    [provider]  SUMAtriptan (IMITREX) 100 MG tablet Take 100 mg by mouth every 2 (two) hours as needed for migraine. For migraines 03/26/16   [provider]  tamsulosin (FLOMAX) 0.4 MG CAPS capsule Take 0.4 mg by mouth daily.    [provider]    Family History Family History  Problem Relation Age of Onset   Kidney cancer Father    Alcohol abuse Father     Colon cancer Neg Hx    Celiac disease Neg Hx    Inflammatory bowel disease Neg Hx    Colon polyps Neg Hx     Social History Social History   Tobacco Use   Smoking status: Never   Smokeless tobacco: Never  Vaping Use   Vaping status: Never Used  Substance Use Topics   Alcohol use: No   Drug use: No     Allergies   Patient has no known allergies.   Review of Systems Review of Systems Per HPI  Physical Exam Triage Vital Signs ED Triage Vitals  Encounter Vitals Group     BP 09/22/23 1458 129/74     Systolic BP Percentile --      Diastolic BP Percentile --      Pulse Rate 09/22/23 1458 61     Resp 09/22/23 1458 18     Temp 09/22/23 1458 97.9 F (36.6 C)     Temp Source 09/22/23 1458 Oral     SpO2 09/22/23 1458 97 %     Weight --      Height --      Head Circumference --      Peak Flow --      Pain Score 09/22/23 1501 0     Pain Loc --      Pain Education --      Exclude from Growth Chart --    No data found.  Updated Vital Signs BP 129/74 (BP Location: Right Arm)   Pulse 61   Temp 97.9 F (36.6 C) (Oral)   Resp 18   SpO2 97%   Visual Acuity Right Eye Distance:   Left Eye Distance:   Bilateral Distance:    Right Eye Near:   Left Eye Near:    Bilateral Near:     Physical Exam Vitals and nursing note reviewed.  Constitutional:      General: He is not in acute distress.    Appearance: Normal appearance. He is not ill-appearing or toxic-appearing.  HENT:     Head: Normocephalic and atraumatic.     Right Ear: Tympanic membrane, ear canal and external ear normal.     Left Ear: Tympanic membrane, ear canal and external ear normal.     Nose: Congestion present. No rhinorrhea.     Mouth/Throat:     Mouth: Mucous membranes are moist.     Pharynx: Oropharynx is clear. No oropharyngeal exudate or posterior oropharyngeal erythema.  Eyes:     General: No scleral icterus.    Extraocular Movements: Extraocular movements intact.  Cardiovascular:      Rate and Rhythm: Normal rate and regular rhythm.  Pulmonary:     Effort: Pulmonary effort is normal. No respiratory distress.     Breath sounds: Normal breath sounds. No wheezing, rhonchi or rales.  Abdominal:  General: Abdomen is flat. Bowel sounds are normal. There is no distension.     Palpations: Abdomen is soft.  Musculoskeletal:     Cervical back: Normal range of motion and neck supple.  Lymphadenopathy:     Cervical: No cervical adenopathy.  Skin:    General: Skin is warm and dry.     Coloration: Skin is not jaundiced or pale.     Findings: No erythema or rash.  Neurological:     Mental Status: He is alert and oriented to person, place, and time.  Psychiatric:        Behavior: Behavior is cooperative.      UC Treatments / Results  Labs (all labs ordered are listed, but only abnormal results are displayed) Labs Reviewed  SARS CORONAVIRUS 2 (TAT 6-24 HRS)    EKG   Radiology No results found.  Procedures Procedures (including critical care time)  Medications Ordered in UC Medications - No data to display  Initial Impression / Assessment and Plan / UC Course  I have reviewed the triage vital signs and the nursing notes.  Pertinent labs & imaging results that were available during my care of the patient were reviewed by me and considered in my medical decision making (see chart for details).   Patient is well-appearing, normotensive, afebrile, not tachycardic, not tachypneic, oxygenating well on room air.    1. Viral URI with cough 2. Encounter for screening for COVID-19 Suspect viral etiology Vitals and exam today are reassuring COVID-19 testing obtained Patient is a candidate for Paxlovid if positive; last GFR greater than 60 Recommend checking blood pressure daily while on Paxlovid with Flomax, if blood pressure low, hold Flomax for the day Other supportive care discussed, start cough suppressant medication Strict ER and return precautions discussed  with patient Work excuse given  The patient was given the opportunity to ask questions.  All questions answered to their satisfaction.  The patient is in agreement to this plan.    Final Clinical Impressions(s) / UC Diagnoses   Final diagnoses:  Viral URI with cough  Encounter for screening for COVID-19     Discharge Instructions      You have a viral upper respiratory infection.  Symptoms should improve over the next week to 10 days.  If you develop chest pain or shortness of breath, go to the emergency room.  We have tested you today for COVID-19.  You will see the results in Mychart and we will contact you with positive results.  Please stay home and isolate until you are fever free for 24 hours without fever reducing medication.    If you test positive for COVID-19, recommend starting Paxlovid.  Recommend checking blood pressure daily while on Paxlovid and stopping Flomax until Paxlovid is done if blood pressure less than 100/60 and you feel dizzy.  Some things that can make you feel better are: - Increased rest - Increasing fluid with water/sugar free electrolytes - Acetaminophen and ibuprofen as needed for fever/pain - Salt water gargling, chloraseptic spray and throat lozenges - OTC guaifenesin (Mucinex) 600 mg twice daily - Saline sinus flushes or a neti pot - Humidifying the air -Tessalon Perles every 8 hours as needed for dry cough       ED Prescriptions     Medication Sig Dispense Auth. Provider   benzonatate (TESSALON) 100 MG capsule Take 1 capsule (100 mg total) by mouth 3 (three) times daily as needed for cough. Do not take with alcohol or while  driving or operating heavy machinery.  May cause drowsiness. 21 capsule Valentino Nose, NP      PDMP not reviewed this encounter.   Valentino Nose, NP 09/22/23 1530

## 2023-09-22 NOTE — ED Triage Notes (Signed)
Pt c/o cough and congestion, x 3 day, pt has used  Mucinex DM and cold and flu has felt a little better.

## 2023-09-22 NOTE — Discharge Instructions (Addendum)
You have a viral upper respiratory infection.  Symptoms should improve over the next week to 10 days.  If you develop chest pain or shortness of breath, go to the emergency room.  We have tested you today for COVID-19.  You will see the results in Mychart and we will contact you with positive results.  Please stay home and isolate until you are fever free for 24 hours without fever reducing medication.    If you test positive for COVID-19, recommend starting Paxlovid.  Recommend checking blood pressure daily while on Paxlovid and stopping Flomax until Paxlovid is done if blood pressure less than 100/60 and you feel dizzy.  Some things that can make you feel better are: - Increased rest - Increasing fluid with water/sugar free electrolytes - Acetaminophen and ibuprofen as needed for fever/pain - Salt water gargling, chloraseptic spray and throat lozenges - OTC guaifenesin (Mucinex) 600 mg twice daily - Saline sinus flushes or a neti pot - Humidifying the air -Tessalon Perles every 8 hours as needed for dry cough

## 2023-09-23 LAB — SARS CORONAVIRUS 2 (TAT 6-24 HRS): SARS Coronavirus 2: NEGATIVE

## 2023-09-30 DIAGNOSIS — J069 Acute upper respiratory infection, unspecified: Secondary | ICD-10-CM | POA: Diagnosis not present

## 2023-09-30 DIAGNOSIS — M069 Rheumatoid arthritis, unspecified: Secondary | ICD-10-CM | POA: Diagnosis not present

## 2023-09-30 DIAGNOSIS — Z6824 Body mass index (BMI) 24.0-24.9, adult: Secondary | ICD-10-CM | POA: Diagnosis not present

## 2023-10-02 DIAGNOSIS — R5382 Chronic fatigue, unspecified: Secondary | ICD-10-CM | POA: Diagnosis not present

## 2023-10-02 DIAGNOSIS — M0609 Rheumatoid arthritis without rheumatoid factor, multiple sites: Secondary | ICD-10-CM | POA: Diagnosis not present

## 2023-10-02 DIAGNOSIS — M7989 Other specified soft tissue disorders: Secondary | ICD-10-CM | POA: Diagnosis not present

## 2023-11-24 ENCOUNTER — Other Ambulatory Visit (HOSPITAL_COMMUNITY): Payer: Self-pay | Admitting: Family Medicine

## 2023-11-24 ENCOUNTER — Ambulatory Visit (HOSPITAL_COMMUNITY)
Admission: RE | Admit: 2023-11-24 | Discharge: 2023-11-24 | Disposition: A | Discharge status: Home / Self Care | Payer: Medicare Other | Source: Ambulatory Visit | Attending: Family Medicine | Admitting: Family Medicine

## 2023-11-24 DIAGNOSIS — Z6824 Body mass index (BMI) 24.0-24.9, adult: Secondary | ICD-10-CM | POA: Diagnosis not present

## 2023-11-24 DIAGNOSIS — J069 Acute upper respiratory infection, unspecified: Secondary | ICD-10-CM | POA: Diagnosis not present

## 2023-11-27 ENCOUNTER — Ambulatory Visit: Payer: Medicare Other | Admitting: Gastroenterology

## 2023-11-28 NOTE — Progress Notes (Unsigned)
GI Office Note    Referring Provider: Assunta Found, MD Primary Care Physician:  Assunta Found, MD Primary Gastroenterologist: Gerrit Friends.Rourk, MD  Date:  12/01/2023  ID:  Gary Cole, DOB 05/20/1956, MRN [id number]  Chief Complaint   Chief Complaint  Patient presents with   Follow-up    Pt arrives for follow up. States he is doing well. No concerns at this time.    History of Present Illness  Gary Cole is a 67 y.o. male with a history of GERD, hiatal hernia, rheumatoid arthritis, and hypertension presenting today for follow-up.  EGD August 2019: -Severe erosive/ulcerative esophagitis s/p dilation -Small hiatal hernia -Normal duodenum -No evidence of tumor, lymph node seen on recent CT possibly reactive -Began Protonix 40 mg twice daily -Consider repeat CT of GE junction in 3 months.   Colonoscopy April 2017: -Entire examined colon normal.  No specimens collected. -Repeat colonoscopy in 10 years.  OV with Dr. Jena Gauss 01/28/23.  Presented with a 1 week history of gas bloating as well as lower abdominal pain. Reportedly symptoms of abdominal pain have resolved.  Having 1 good stool daily.  No blood or melena. Denied any constipation or diarrhea.  History of complicated reflux esophagitis with stricture dilated previously.  No evidence of Barrett's esophagus.  Symptoms controlled with Protonix 40 mg daily.  Gas bloat symptoms nicely responded to fiber supplementation and probiotic.  Recently lost fianc to colon cancer.  Plan for average risk screening colonoscopy in 2027.  Advised to continue Protonix 40 mg daily 30 minutes before breakfast.  Advised we will check for stool occult if positive schedule colonoscopy now.   Fecal occult was negative. Labs in February 2024 with stable hemoglobin and BMP.  Last office visit 05/27/23.  Bloating, abdominal pain, and reflux resolved.  Denies any issues with appetite.  Continued on PPI once daily.  Recently lost his fiance due to colon  cancer.  Denied any constipation or diarrhea.  Advised to continue pantoprazole 40 mg once daily for now and consider weaning in 6 months.  Due for colon cancer screening in 2027.   Today: Patient reports reflux has been doing well over the last 6 months.  He has not had any breakthrough symptoms.  He denies any nausea, vomiting, dysphagia, lack of appetite, early satiety.  He has actually had some weight gain since his last office visit but this is an improvement given the significant weight loss initially after the loss of his fiance last year.  Also denies any melena or BRBPR.  No falls, dizziness, lightheadedness, or syncope.  He denies any diarrhea or overt abdominal pain.  He has had some mild intermittent constipation but only recently given he has been taking steroids, antibiotic, and cough syrup with codeine for upper respiratory symptoms.  Prior to taking all these medications he did not have any issues with constipation.   Wt Readings from Last 3 Encounters:  12/01/23 166 lb 11.2 oz (75.6 kg)  05/27/23 164 lb 8 oz (74.6 kg)  02/07/23 160 lb (72.6 kg)    Current Outpatient Medications  Medication Sig Dispense Refill   amoxicillin-clavulanate (AUGMENTIN) 875-125 MG tablet SMARTSIG:1 Tablet(s) By Mouth Every 12 Hours     benzonatate (TESSALON) 100 MG capsule Take 1 capsule (100 mg total) by mouth 3 (three) times daily as needed for cough. Do not take with alcohol or while driving or operating heavy machinery.  May cause drowsiness. 21 capsule 0   chlorpheniramine-HYDROcodone (TUSSIONEX) 10-8 MG/5ML SMARTSIG:5 Milliliter(s)  By Mouth Every 12 Hours PRN     etanercept (ENBREL SURECLICK) 50 MG/ML injection Inject 50 mg into the skin every Wednesday.     folic acid (FOLVITE) 400 MCG tablet Take 400 mcg by mouth daily before lunch.     levothyroxine (SYNTHROID) 75 MCG tablet Take 75 mcg by mouth daily before breakfast.     methotrexate 2.5 MG tablet Take 10-12.5 mg by mouth See admin  instructions. Take 5 tablets (12.5 mg) by mouth every Tuesday & take 4 tablets (10 mg) by mouth every Thursday  3   naproxen sodium (ALEVE) 220 MG tablet Take 220 mg by mouth 2 (two) times daily as needed (pain.).     olmesartan (BENICAR) 40 MG tablet Take 40 mg by mouth daily before lunch.     pantoprazole (PROTONIX) 40 MG tablet TAKE 1 TABLET BY MOUTH ONCE DAILY BEFORE BREAKFAST 90 tablet 3   predniSONE (DELTASONE) 5 MG tablet Take 5 mg by mouth See admin instructions. Take 1 tablet (5 mg) by mouth twice weekly on Tuesdays & Thursdays.  2   Saw Palmetto 450 MG CAPS Take 450 mg by mouth daily before lunch.     SUMAtriptan (IMITREX) 100 MG tablet Take 100 mg by mouth every 2 (two) hours as needed for migraine. For migraines  0   tamsulosin (FLOMAX) 0.4 MG CAPS capsule Take 0.4 mg by mouth daily.     No current facility-administered medications for this visit.    Past Medical History:  Diagnosis Date   GERD (gastroesophageal reflux disease)    Headache    Hypertension    Rheumatoid arthritis (HCC)     Past Surgical History:  Procedure Laterality Date   ANKLE SURGERY Left    CARPAL TUNNEL RELEASE Left    CHOLECYSTECTOMY  2008   COLONOSCOPY N/A 04/12/2016   Dr. Lovell Sheehan: normal.    CYST EXCISION     neck   ESOPHAGOGASTRODUODENOSCOPY N/A 07/29/2018   Dr. Jena Gauss: severe erosive/ulcerative esophagitis s/p diltaation, small hiatal hernia, normal duodenum   FOOT ARTHRODESIS Left 02/07/2023   Procedure: LEFT FOOT REMOVAL OF HARDWARE AND REVISION FUSION BASE 1ST METATARSAL;  Surgeon: Nadara Mustard, MD;  Location: [facility];  Service: Orthopedics;  Laterality: Left;   KNEE SURGERY Right    MALONEY DILATION N/A 07/29/2018   Procedure: Elease Hashimoto DILATION;  Surgeon: Corbin Ade, MD;  Location: [facility];  Service: Endoscopy;  Laterality: N/A;    Family History  Problem Relation Age of Onset   Kidney cancer Father    Alcohol abuse Father    Colon cancer Neg Hx    Celiac disease Neg Hx     Inflammatory bowel disease Neg Hx    Colon polyps Neg Hx     Allergies as of 12/01/2023   (No Known Allergies)    Social History   Socioeconomic History   Marital status: Single    Spouse name: Not on file   Number of children: Not on file   Years of education: Not on file   Highest education level: Not on file  Occupational History   Not on file  Tobacco Use   Smoking status: Never   Smokeless tobacco: Never  Vaping Use   Vaping status: Never Used  Substance and Sexual Activity   Alcohol use: No   Drug use: No   Sexual activity: Not on file  Other Topics Concern   Not on file  Social History Narrative   Not on file  Social Determinants of Health   Financial Resource Strain: Not on file  Food Insecurity: Not on file  Transportation Needs: Not on file  Physical Activity: Not on file  Stress: Not on file  Social Connections: Not on file     Review of Systems   Gen: Denies fever, chills, anorexia. Denies fatigue, weakness, weight loss.  CV: Denies chest pain, palpitations, syncope, peripheral edema, and claudication. Resp: Denies dyspnea at rest, cough, wheezing, coughing up blood, and pleurisy. GI: See HPI Derm: Denies rash, itching, dry skin Psych: Denies depression, anxiety, memory loss, confusion. No homicidal or suicidal ideation.  Heme: Denies bruising, bleeding, and enlarged lymph nodes. + intermittent joint pain  Physical Exam   BP (!) 107/54   Pulse 71   Temp (!) 97.5 F (36.4 C)   Ht 5\' 10"  (1.778 m)   Wt 166 lb 11.2 oz (75.6 kg)   BMI 23.92 kg/m   General:   Alert and oriented. No distress noted. Pleasant and cooperative.  Head:  Normocephalic and atraumatic. Eyes:  Conjuctiva clear without scleral icterus. Abdomen:  +BS, soft, non-tender and non-distended. No rebound or guarding. No HSM or masses noted. Rectal: deferred Msk:  Symmetrical without gross deformities. Normal posture. Extremities:  Without edema. Neurologic:  Alert and   oriented x4 Psych:  Alert and cooperative. Normal mood and affect.  Assessment  Gary Cole is a 67 y.o. male with a history of GERD, hiatal hernia, rheumatoid arthritis, and hypertension presenting today for follow-up.  GERD: Well-controlled with pantoprazole 40 mg once daily.  Denies any nausea, vomiting, or dysphagia.  Discussed potentially weaning PPI however patient would like to stay on this given he has had great control of symptoms currently.  If he decides to wean in the future I would recommend is decreasing to pantoprazole 20 mg once daily and then weaning further thereafter.  Constipation: Mild and recent acute issue likely secondary to consumption of cough syrup with codeine.  Discussed using MiraLAX as needed and advised that consistent use as needed to see good results.  Advised him to contact the office if this continues despite discontinuation of cough syrup with codeine.  PLAN   Continue pantoprazole 40 mg once daily. Refilled today.  GERD diet Miralax as needed for constipation Follow up 1 year.     Brooke Bonito, MSN, FNP-BC, AGACNP-BC [facility]

## 2023-12-01 ENCOUNTER — Encounter: Payer: Self-pay | Admitting: Gastroenterology

## 2023-12-01 ENCOUNTER — Ambulatory Visit (INDEPENDENT_AMBULATORY_CARE_PROVIDER_SITE_OTHER): Payer: Medicare Other | Admitting: Gastroenterology

## 2023-12-01 VITALS — BP 107/54 | HR 71 | Temp 97.5°F | Ht 70.0 in | Wt 166.7 lb

## 2023-12-01 DIAGNOSIS — K219 Gastro-esophageal reflux disease without esophagitis: Secondary | ICD-10-CM

## 2023-12-01 DIAGNOSIS — K5900 Constipation, unspecified: Secondary | ICD-10-CM | POA: Diagnosis not present

## 2023-12-01 DIAGNOSIS — K5903 Drug induced constipation: Secondary | ICD-10-CM

## 2023-12-01 MED ORDER — PANTOPRAZOLE SODIUM 40 MG PO TBEC: 40.0000 mg | DELAYED_RELEASE_TABLET | Freq: Every day | ORAL | 3 refills | Status: AC

## 2023-12-01 NOTE — Patient Instructions (Signed)
Great to see you again today!  Continue taking pantoprazole 40 mg once daily.  I sent refill to the pharmacy for you!  Follow a GERD diet:  Avoid fried, fatty, greasy, spicy, citrus foods. Avoid caffeine and carbonated beverages. Avoid chocolate. Try eating 4-6 small meals a day rather than 3 large meals. Do not eat within 3 hours of laying down. Prop head of bed up on wood or bricks to create a 6 inch incline.  For intermittent constipation I recommend you to use MiraLAX 17 g (1 capful) as needed.  When you go through bouts of constipation you should take a dose for a couple of consecutive days in order to see a good effect.  It is not uncommon when you take anything that has a narcotic in it including codeine with an cough syrup to have some constipation.  We will plan to follow-up in 1 year, sooner if needed.  I want to create trusting relationships with patients. If you receive a survey regarding your visit,  I greatly appreciate you taking time to fill this out on paper or through your MyChart. I value your feedback.  Brooke Bonito, MSN, FNP-BC, AGACNP-BC [facility]

## 2024-01-05 DIAGNOSIS — M546 Pain in thoracic spine: Secondary | ICD-10-CM | POA: Diagnosis not present

## 2024-01-05 DIAGNOSIS — M9902 Segmental and somatic dysfunction of thoracic region: Secondary | ICD-10-CM | POA: Diagnosis not present

## 2024-01-05 DIAGNOSIS — M62830 Muscle spasm of back: Secondary | ICD-10-CM | POA: Diagnosis not present

## 2024-01-05 DIAGNOSIS — M9901 Segmental and somatic dysfunction of cervical region: Secondary | ICD-10-CM | POA: Diagnosis not present

## 2024-01-05 DIAGNOSIS — M542 Cervicalgia: Secondary | ICD-10-CM | POA: Diagnosis not present

## 2024-01-05 DIAGNOSIS — M9903 Segmental and somatic dysfunction of lumbar region: Secondary | ICD-10-CM | POA: Diagnosis not present

## 2024-01-09 DIAGNOSIS — M9901 Segmental and somatic dysfunction of cervical region: Secondary | ICD-10-CM | POA: Diagnosis not present

## 2024-01-09 DIAGNOSIS — M9902 Segmental and somatic dysfunction of thoracic region: Secondary | ICD-10-CM | POA: Diagnosis not present

## 2024-01-09 DIAGNOSIS — M546 Pain in thoracic spine: Secondary | ICD-10-CM | POA: Diagnosis not present

## 2024-01-09 DIAGNOSIS — M542 Cervicalgia: Secondary | ICD-10-CM | POA: Diagnosis not present

## 2024-01-09 DIAGNOSIS — M62830 Muscle spasm of back: Secondary | ICD-10-CM | POA: Diagnosis not present

## 2024-01-09 DIAGNOSIS — M9903 Segmental and somatic dysfunction of lumbar region: Secondary | ICD-10-CM | POA: Diagnosis not present

## 2024-01-12 DIAGNOSIS — M542 Cervicalgia: Secondary | ICD-10-CM | POA: Diagnosis not present

## 2024-01-12 DIAGNOSIS — M62830 Muscle spasm of back: Secondary | ICD-10-CM | POA: Diagnosis not present

## 2024-01-12 DIAGNOSIS — M9902 Segmental and somatic dysfunction of thoracic region: Secondary | ICD-10-CM | POA: Diagnosis not present

## 2024-01-12 DIAGNOSIS — M546 Pain in thoracic spine: Secondary | ICD-10-CM | POA: Diagnosis not present

## 2024-01-12 DIAGNOSIS — M9901 Segmental and somatic dysfunction of cervical region: Secondary | ICD-10-CM | POA: Diagnosis not present

## 2024-01-12 DIAGNOSIS — M9903 Segmental and somatic dysfunction of lumbar region: Secondary | ICD-10-CM | POA: Diagnosis not present

## 2024-01-16 DIAGNOSIS — M542 Cervicalgia: Secondary | ICD-10-CM | POA: Diagnosis not present

## 2024-01-16 DIAGNOSIS — M546 Pain in thoracic spine: Secondary | ICD-10-CM | POA: Diagnosis not present

## 2024-01-16 DIAGNOSIS — M9903 Segmental and somatic dysfunction of lumbar region: Secondary | ICD-10-CM | POA: Diagnosis not present

## 2024-01-16 DIAGNOSIS — M9901 Segmental and somatic dysfunction of cervical region: Secondary | ICD-10-CM | POA: Diagnosis not present

## 2024-01-16 DIAGNOSIS — M62830 Muscle spasm of back: Secondary | ICD-10-CM | POA: Diagnosis not present

## 2024-01-16 DIAGNOSIS — M9902 Segmental and somatic dysfunction of thoracic region: Secondary | ICD-10-CM | POA: Diagnosis not present

## 2024-01-23 DIAGNOSIS — M9903 Segmental and somatic dysfunction of lumbar region: Secondary | ICD-10-CM | POA: Diagnosis not present

## 2024-01-23 DIAGNOSIS — M9902 Segmental and somatic dysfunction of thoracic region: Secondary | ICD-10-CM | POA: Diagnosis not present

## 2024-01-23 DIAGNOSIS — M9901 Segmental and somatic dysfunction of cervical region: Secondary | ICD-10-CM | POA: Diagnosis not present

## 2024-01-23 DIAGNOSIS — M542 Cervicalgia: Secondary | ICD-10-CM | POA: Diagnosis not present

## 2024-01-23 DIAGNOSIS — M546 Pain in thoracic spine: Secondary | ICD-10-CM | POA: Diagnosis not present

## 2024-01-23 DIAGNOSIS — M62830 Muscle spasm of back: Secondary | ICD-10-CM | POA: Diagnosis not present

## 2024-01-28 DIAGNOSIS — M546 Pain in thoracic spine: Secondary | ICD-10-CM | POA: Diagnosis not present

## 2024-01-28 DIAGNOSIS — M9901 Segmental and somatic dysfunction of cervical region: Secondary | ICD-10-CM | POA: Diagnosis not present

## 2024-01-28 DIAGNOSIS — M9902 Segmental and somatic dysfunction of thoracic region: Secondary | ICD-10-CM | POA: Diagnosis not present

## 2024-01-28 DIAGNOSIS — H04123 Dry eye syndrome of bilateral lacrimal glands: Secondary | ICD-10-CM | POA: Diagnosis not present

## 2024-01-28 DIAGNOSIS — M62830 Muscle spasm of back: Secondary | ICD-10-CM | POA: Diagnosis not present

## 2024-01-28 DIAGNOSIS — M542 Cervicalgia: Secondary | ICD-10-CM | POA: Diagnosis not present

## 2024-01-28 DIAGNOSIS — M9903 Segmental and somatic dysfunction of lumbar region: Secondary | ICD-10-CM | POA: Diagnosis not present

## 2024-01-30 DIAGNOSIS — M546 Pain in thoracic spine: Secondary | ICD-10-CM | POA: Diagnosis not present

## 2024-01-30 DIAGNOSIS — M62830 Muscle spasm of back: Secondary | ICD-10-CM | POA: Diagnosis not present

## 2024-01-30 DIAGNOSIS — M542 Cervicalgia: Secondary | ICD-10-CM | POA: Diagnosis not present

## 2024-01-30 DIAGNOSIS — M9903 Segmental and somatic dysfunction of lumbar region: Secondary | ICD-10-CM | POA: Diagnosis not present

## 2024-01-30 DIAGNOSIS — M9902 Segmental and somatic dysfunction of thoracic region: Secondary | ICD-10-CM | POA: Diagnosis not present

## 2024-01-30 DIAGNOSIS — M9901 Segmental and somatic dysfunction of cervical region: Secondary | ICD-10-CM | POA: Diagnosis not present

## 2024-02-03 DIAGNOSIS — M9901 Segmental and somatic dysfunction of cervical region: Secondary | ICD-10-CM | POA: Diagnosis not present

## 2024-02-03 DIAGNOSIS — M9902 Segmental and somatic dysfunction of thoracic region: Secondary | ICD-10-CM | POA: Diagnosis not present

## 2024-02-03 DIAGNOSIS — M9903 Segmental and somatic dysfunction of lumbar region: Secondary | ICD-10-CM | POA: Diagnosis not present

## 2024-02-03 DIAGNOSIS — M546 Pain in thoracic spine: Secondary | ICD-10-CM | POA: Diagnosis not present

## 2024-02-03 DIAGNOSIS — M542 Cervicalgia: Secondary | ICD-10-CM | POA: Diagnosis not present

## 2024-02-03 DIAGNOSIS — M62830 Muscle spasm of back: Secondary | ICD-10-CM | POA: Diagnosis not present

## 2024-02-04 DIAGNOSIS — H04123 Dry eye syndrome of bilateral lacrimal glands: Secondary | ICD-10-CM | POA: Diagnosis not present

## 2024-02-04 DIAGNOSIS — H02824 Cysts of left upper eyelid: Secondary | ICD-10-CM | POA: Diagnosis not present

## 2024-02-04 DIAGNOSIS — H02821 Cysts of right upper eyelid: Secondary | ICD-10-CM | POA: Diagnosis not present

## 2024-02-05 DIAGNOSIS — L720 Epidermal cyst: Secondary | ICD-10-CM | POA: Diagnosis not present

## 2024-02-09 DIAGNOSIS — M9902 Segmental and somatic dysfunction of thoracic region: Secondary | ICD-10-CM | POA: Diagnosis not present

## 2024-02-09 DIAGNOSIS — M9903 Segmental and somatic dysfunction of lumbar region: Secondary | ICD-10-CM | POA: Diagnosis not present

## 2024-02-09 DIAGNOSIS — M542 Cervicalgia: Secondary | ICD-10-CM | POA: Diagnosis not present

## 2024-02-09 DIAGNOSIS — M9901 Segmental and somatic dysfunction of cervical region: Secondary | ICD-10-CM | POA: Diagnosis not present

## 2024-02-09 DIAGNOSIS — M546 Pain in thoracic spine: Secondary | ICD-10-CM | POA: Diagnosis not present

## 2024-02-09 DIAGNOSIS — M62830 Muscle spasm of back: Secondary | ICD-10-CM | POA: Diagnosis not present

## 2024-02-11 DIAGNOSIS — M542 Cervicalgia: Secondary | ICD-10-CM | POA: Diagnosis not present

## 2024-02-11 DIAGNOSIS — M62830 Muscle spasm of back: Secondary | ICD-10-CM | POA: Diagnosis not present

## 2024-02-11 DIAGNOSIS — M9902 Segmental and somatic dysfunction of thoracic region: Secondary | ICD-10-CM | POA: Diagnosis not present

## 2024-02-11 DIAGNOSIS — M546 Pain in thoracic spine: Secondary | ICD-10-CM | POA: Diagnosis not present

## 2024-02-11 DIAGNOSIS — M9903 Segmental and somatic dysfunction of lumbar region: Secondary | ICD-10-CM | POA: Diagnosis not present

## 2024-02-11 DIAGNOSIS — M9901 Segmental and somatic dysfunction of cervical region: Secondary | ICD-10-CM | POA: Diagnosis not present

## 2024-02-16 DIAGNOSIS — M9901 Segmental and somatic dysfunction of cervical region: Secondary | ICD-10-CM | POA: Diagnosis not present

## 2024-02-16 DIAGNOSIS — M546 Pain in thoracic spine: Secondary | ICD-10-CM | POA: Diagnosis not present

## 2024-02-16 DIAGNOSIS — M9903 Segmental and somatic dysfunction of lumbar region: Secondary | ICD-10-CM | POA: Diagnosis not present

## 2024-02-16 DIAGNOSIS — M542 Cervicalgia: Secondary | ICD-10-CM | POA: Diagnosis not present

## 2024-02-16 DIAGNOSIS — M62830 Muscle spasm of back: Secondary | ICD-10-CM | POA: Diagnosis not present

## 2024-02-16 DIAGNOSIS — M9902 Segmental and somatic dysfunction of thoracic region: Secondary | ICD-10-CM | POA: Diagnosis not present

## 2024-02-23 DIAGNOSIS — M542 Cervicalgia: Secondary | ICD-10-CM | POA: Diagnosis not present

## 2024-02-23 DIAGNOSIS — M62830 Muscle spasm of back: Secondary | ICD-10-CM | POA: Diagnosis not present

## 2024-02-23 DIAGNOSIS — M9901 Segmental and somatic dysfunction of cervical region: Secondary | ICD-10-CM | POA: Diagnosis not present

## 2024-02-23 DIAGNOSIS — M546 Pain in thoracic spine: Secondary | ICD-10-CM | POA: Diagnosis not present

## 2024-02-23 DIAGNOSIS — M9902 Segmental and somatic dysfunction of thoracic region: Secondary | ICD-10-CM | POA: Diagnosis not present

## 2024-02-23 DIAGNOSIS — M9903 Segmental and somatic dysfunction of lumbar region: Secondary | ICD-10-CM | POA: Diagnosis not present

## 2024-03-01 DIAGNOSIS — M546 Pain in thoracic spine: Secondary | ICD-10-CM | POA: Diagnosis not present

## 2024-03-01 DIAGNOSIS — M62830 Muscle spasm of back: Secondary | ICD-10-CM | POA: Diagnosis not present

## 2024-03-01 DIAGNOSIS — M9901 Segmental and somatic dysfunction of cervical region: Secondary | ICD-10-CM | POA: Diagnosis not present

## 2024-03-01 DIAGNOSIS — M542 Cervicalgia: Secondary | ICD-10-CM | POA: Diagnosis not present

## 2024-03-01 DIAGNOSIS — M9902 Segmental and somatic dysfunction of thoracic region: Secondary | ICD-10-CM | POA: Diagnosis not present

## 2024-03-01 DIAGNOSIS — M9903 Segmental and somatic dysfunction of lumbar region: Secondary | ICD-10-CM | POA: Diagnosis not present

## 2024-03-04 ENCOUNTER — Ambulatory Visit (HOSPITAL_COMMUNITY)
Admission: RE | Admit: 2024-03-04 | Discharge: 2024-03-04 | Disposition: A | Discharge status: Home / Self Care | Source: Ambulatory Visit | Attending: Family Medicine | Admitting: Family Medicine

## 2024-03-04 ENCOUNTER — Other Ambulatory Visit (HOSPITAL_COMMUNITY): Payer: Self-pay | Admitting: Family Medicine

## 2024-03-04 DIAGNOSIS — S76219A Strain of adductor muscle, fascia and tendon of unspecified thigh, initial encounter: Secondary | ICD-10-CM

## 2024-03-04 DIAGNOSIS — Z6824 Body mass index (BMI) 24.0-24.9, adult: Secondary | ICD-10-CM | POA: Diagnosis not present

## 2024-03-04 DIAGNOSIS — R1030 Lower abdominal pain, unspecified: Secondary | ICD-10-CM | POA: Diagnosis not present

## 2024-03-04 DIAGNOSIS — M069 Rheumatoid arthritis, unspecified: Secondary | ICD-10-CM | POA: Diagnosis not present

## 2024-03-11 ENCOUNTER — Other Ambulatory Visit (HOSPITAL_COMMUNITY): Payer: Self-pay | Admitting: Internal Medicine

## 2024-03-11 ENCOUNTER — Ambulatory Visit (HOSPITAL_COMMUNITY)
Admission: RE | Admit: 2024-03-11 | Discharge: 2024-03-11 | Disposition: A | Discharge status: Home / Self Care | Source: Ambulatory Visit | Attending: Internal Medicine | Admitting: Internal Medicine

## 2024-03-11 DIAGNOSIS — K5900 Constipation, unspecified: Secondary | ICD-10-CM | POA: Diagnosis not present

## 2024-03-11 DIAGNOSIS — R1030 Lower abdominal pain, unspecified: Secondary | ICD-10-CM | POA: Diagnosis not present

## 2024-03-11 DIAGNOSIS — M069 Rheumatoid arthritis, unspecified: Secondary | ICD-10-CM | POA: Diagnosis not present

## 2024-03-11 DIAGNOSIS — Z6824 Body mass index (BMI) 24.0-24.9, adult: Secondary | ICD-10-CM | POA: Diagnosis not present

## 2024-03-11 MED ORDER — IOHEXOL 9 MG/ML PO SOLN
ORAL | Status: AC
  Filled 2024-03-11: qty 1000

## 2024-03-11 MED ORDER — IOHEXOL 300 MG/ML  SOLN
100.0000 mL | Freq: Once | INTRAMUSCULAR | Status: AC | PRN
  Administered 2024-03-11: 100 mL via INTRAVENOUS

## 2024-03-11 MED ORDER — IOHEXOL 9 MG/ML PO SOLN
500.0000 mL | ORAL | Status: AC
  Administered 2024-03-11: 500 mL via ORAL

## 2024-03-16 DIAGNOSIS — H02824 Cysts of left upper eyelid: Secondary | ICD-10-CM | POA: Diagnosis not present

## 2024-03-17 DIAGNOSIS — M62830 Muscle spasm of back: Secondary | ICD-10-CM | POA: Diagnosis not present

## 2024-03-17 DIAGNOSIS — M542 Cervicalgia: Secondary | ICD-10-CM | POA: Diagnosis not present

## 2024-03-17 DIAGNOSIS — M9902 Segmental and somatic dysfunction of thoracic region: Secondary | ICD-10-CM | POA: Diagnosis not present

## 2024-03-17 DIAGNOSIS — M546 Pain in thoracic spine: Secondary | ICD-10-CM | POA: Diagnosis not present

## 2024-03-17 DIAGNOSIS — M9903 Segmental and somatic dysfunction of lumbar region: Secondary | ICD-10-CM | POA: Diagnosis not present

## 2024-03-17 DIAGNOSIS — M9901 Segmental and somatic dysfunction of cervical region: Secondary | ICD-10-CM | POA: Diagnosis not present

## 2024-03-22 NOTE — H&P (View-Only) (Signed)
 GI Office Note    Referring Provider: Assunta Found, MD Primary Care Physician:  Assunta Found, MD Primary Gastroenterologist: Gerrit Friends.Rourk, MD  Date:  03/22/2024  ID:  Gary Cole, DOB 09-07-1956, MRN [id number]   Chief Complaint   No chief complaint on file.  History of Present Illness  Gary Cole is a 68 y.o. male with a history of GERD, hiatal hernia, RA, and HTN presenting today with complaint of constipation.  EGD August 2019: -Severe erosive/ulcerative esophagitis s/p dilation -Small hiatal hernia -Normal duodenum -No evidence of tumor, lymph node seen on recent CT possibly reactive -Began Protonix 40 mg twice daily -Consider repeat CT of GE junction in 3 months.   Colonoscopy April 2017: -Entire examined colon normal.  No specimens collected. -Repeat colonoscopy in 10 years.   OV by Dr. Jena Gauss 01/28/23.  Presented with a 1 week history of gas bloating as well as lower abdominal pain. Reportedly symptoms of abdominal pain have resolved.  Having 1 good stool daily.  No blood or melena. Denied any constipation or diarrhea.  History of complicated reflux esophagitis with stricture dilated previously.  No evidence of Barrett's esophagus.  Symptoms controlled with Protonix 40 mg daily.  Gas bloat symptoms nicely responded to fiber supplementation and probiotic.  Recently lost fianc to colon cancer.  Plan for average risk screening colonoscopy in 2027.  Advised to continue Protonix 40 mg daily 30 minutes before breakfast.  Advised we will check for stool occult if positive schedule colonoscopy now. Fecal occult was negative.   Labs and February 2024 with stable hemoglobin and BMP.  Last office visit 12/01/23.  Denied any overt diarrhea or abdominal pain.  Has mild intermittent constipation but felt this is secondary to steroids, antibiotics, and cough syrup with codeine for recent URI.  Prior to this no issues with constipation.  Reflux 7 doing well in the prior 6 months  and not had any breakthrough symptoms.  Reported some weight gain since last visit.  Advise MiraLAX as needed for constipation and could take on a daily basis if he continues to have constipation despite discontinuation of coding.  Advised to continue pantoprazole 40 mg once daily.  Discussed weaning, advised if he were to like to wean in the future then to decrease his pantoprazole to 40 mg once daily.  Follow-up 1 year.    Today: GERD-  Constipation-   Wt Readings from Last 3 Encounters:  12/01/23 166 lb 11.2 oz (75.6 kg)  05/27/23 164 lb 8 oz (74.6 kg)  02/07/23 160 lb (72.6 kg)    Current Outpatient Medications  Medication Sig Dispense Refill   amoxicillin-clavulanate (AUGMENTIN) 875-125 MG tablet SMARTSIG:1 Tablet(s) By Mouth Every 12 Hours     benzonatate (TESSALON) 100 MG capsule Take 1 capsule (100 mg total) by mouth 3 (three) times daily as needed for cough. Do not take with alcohol or while driving or operating heavy machinery.  May cause drowsiness. 21 capsule 0   chlorpheniramine-HYDROcodone (TUSSIONEX) 10-8 MG/5ML SMARTSIG:5 Milliliter(s) By Mouth Every 12 Hours PRN     etanercept (ENBREL SURECLICK) 50 MG/ML injection Inject 50 mg into the skin every Wednesday.     folic acid (FOLVITE) 400 MCG tablet Take 400 mcg by mouth daily before lunch.     levothyroxine (SYNTHROID) 75 MCG tablet Take 75 mcg by mouth daily before breakfast.     methotrexate 2.5 MG tablet Take 10-12.5 mg by mouth See admin instructions. Take 5 tablets (12.5 mg) by mouth every  Tuesday & take 4 tablets (10 mg) by mouth every Thursday  3   naproxen sodium (ALEVE) 220 MG tablet Take 220 mg by mouth 2 (two) times daily as needed (pain.).     olmesartan (BENICAR) 40 MG tablet Take 40 mg by mouth daily before lunch.     pantoprazole (PROTONIX) 40 MG tablet Take 1 tablet (40 mg total) by mouth daily before breakfast. 90 tablet 3   predniSONE (DELTASONE) 5 MG tablet Take 5 mg by mouth See admin instructions. Take  1 tablet (5 mg) by mouth twice weekly on Tuesdays & Thursdays.  2   Saw Palmetto 450 MG CAPS Take 450 mg by mouth daily before lunch.     SUMAtriptan (IMITREX) 100 MG tablet Take 100 mg by mouth every 2 (two) hours as needed for migraine. For migraines  0   tamsulosin (FLOMAX) 0.4 MG CAPS capsule Take 0.4 mg by mouth daily.     No current facility-administered medications for this visit.    Past Medical History:  Diagnosis Date   GERD (gastroesophageal reflux disease)    Headache    Hypertension    Rheumatoid arthritis (HCC)     Past Surgical History:  Procedure Laterality Date   ANKLE SURGERY Left    CARPAL TUNNEL RELEASE Left    CHOLECYSTECTOMY  2008   COLONOSCOPY N/A 04/12/2016   Dr. Lovell Sheehan: normal.    CYST EXCISION     neck   ESOPHAGOGASTRODUODENOSCOPY N/A 07/29/2018   Dr. Jena Gauss: severe erosive/ulcerative esophagitis s/p diltaation, small hiatal hernia, normal duodenum   FOOT ARTHRODESIS Left 02/07/2023   Procedure: LEFT FOOT REMOVAL OF HARDWARE AND REVISION FUSION BASE 1ST METATARSAL;  Surgeon: Nadara Mustard, MD;  Location: [facility] OR;  Service: Orthopedics;  Laterality: Left;   KNEE SURGERY Right    MALONEY DILATION N/A 07/29/2018   Procedure: Elease Hashimoto DILATION;  Surgeon: Corbin Ade, MD;  Location: [facility];  Service: Endoscopy;  Laterality: N/A;    Family History  Problem Relation Age of Onset   Kidney cancer Father    Alcohol abuse Father    Colon cancer Neg Hx    Celiac disease Neg Hx    Inflammatory bowel disease Neg Hx    Colon polyps Neg Hx     Allergies as of 03/24/2024   (No Known Allergies)    Social History   Socioeconomic History   Marital status: Single    Spouse name: Not on file   Number of children: Not on file   Years of education: Not on file   Highest education level: Not on file  Occupational History   Not on file  Tobacco Use   Smoking status: Never   Smokeless tobacco: Never  Vaping Use   Vaping status: Never Used  Substance  and Sexual Activity   Alcohol use: No   Drug use: No   Sexual activity: Not on file  Other Topics Concern   Not on file  Social History Narrative   Not on file   Social Drivers of Health   Financial Resource Strain: Not on file  Food Insecurity: Not on file  Transportation Needs: Not on file  Physical Activity: Not on file  Stress: Not on file  Social Connections: Not on file     Review of Systems   Gen: Denies fever, chills, anorexia. Denies fatigue, weakness, weight loss.  CV: Denies chest pain, palpitations, syncope, peripheral edema, and claudication. Resp: Denies dyspnea at rest, cough, wheezing, coughing up  blood, and pleurisy. GI: See HPI Derm: Denies rash, itching, dry skin Psych: Denies depression, anxiety, memory loss, confusion. No homicidal or suicidal ideation.  Heme: Denies bruising, bleeding, and enlarged lymph nodes.  Physical Exam   There were no vitals taken for this visit.  General:   Alert and oriented. No distress noted. Pleasant and cooperative.  Head:  Normocephalic and atraumatic. Eyes:  Conjuctiva clear without scleral icterus. Mouth:  Oral mucosa pink and moist. Good dentition. No lesions. Lungs:  Clear to auscultation bilaterally. No wheezes, rales, or rhonchi. No distress.  Heart:  S1, S2 present without murmurs appreciated.  Abdomen:  +BS, soft, non-tender and non-distended. No rebound or guarding. No HSM or masses noted. Rectal: *** Msk:  Symmetrical without gross deformities. Normal posture. Extremities:  Without edema. Neurologic:  Alert and  oriented x4 Psych:  Alert and cooperative. Normal mood and affect.  Assessment  DAMARIO GILLIE is a 68 y.o. male with a history of GERD, hiatal hernia, RA, and HTN presenting today to discuss ongoing constipation.  Constipation:  GERD:  PLAN   ***     Brooke Bonito, MSN, FNP-BC, AGACNP-BC [facility]

## 2024-03-22 NOTE — Progress Notes (Unsigned)
 GI Office Note    Referring Provider: Assunta Found, MD Primary Care Physician:  Assunta Found, MD Primary Gastroenterologist: Gerrit Friends.Rourk, MD  Date:  03/22/2024  ID:  Gary Cole, DOB 09-07-1956, MRN [id number]   Chief Complaint   No chief complaint on file.  History of Present Illness  Gary Cole is a 68 y.o. male with a history of GERD, hiatal hernia, RA, and HTN presenting today with complaint of constipation.  EGD August 2019: -Severe erosive/ulcerative esophagitis s/p dilation -Small hiatal hernia -Normal duodenum -No evidence of tumor, lymph node seen on recent CT possibly reactive -Began Protonix 40 mg twice daily -Consider repeat CT of GE junction in 3 months.   Colonoscopy April 2017: -Entire examined colon normal.  No specimens collected. -Repeat colonoscopy in 10 years.   OV by Dr. Jena Cole 01/28/23.  Presented with a 1 week history of gas bloating as well as lower abdominal pain. Reportedly symptoms of abdominal pain have resolved.  Having 1 good stool daily.  No blood or melena. Denied any constipation or diarrhea.  History of complicated reflux esophagitis with stricture dilated previously.  No evidence of Barrett's esophagus.  Symptoms controlled with Protonix 40 mg daily.  Gas bloat symptoms nicely responded to fiber supplementation and probiotic.  Recently lost fianc to colon cancer.  Plan for average risk screening colonoscopy in 2027.  Advised to continue Protonix 40 mg daily 30 minutes before breakfast.  Advised we will check for stool occult if positive schedule colonoscopy now. Fecal occult was negative.   Labs and February 2024 with stable hemoglobin and BMP.  Last office visit 12/01/23.  Denied any overt diarrhea or abdominal pain.  Has mild intermittent constipation but felt this is secondary to steroids, antibiotics, and cough syrup with codeine for recent URI.  Prior to this no issues with constipation.  Reflux 7 doing well in the prior 6 months  and not had any breakthrough symptoms.  Reported some weight gain since last visit.  Advise MiraLAX as needed for constipation and could take on a daily basis if he continues to have constipation despite discontinuation of coding.  Advised to continue pantoprazole 40 mg once daily.  Discussed weaning, advised if he were to like to wean in the future then to decrease his pantoprazole to 40 mg once daily.  Follow-up 1 year.    Today: GERD-  Constipation-   Wt Readings from Last 3 Encounters:  12/01/23 166 lb 11.2 oz (75.6 kg)  05/27/23 164 lb 8 oz (74.6 kg)  02/07/23 160 lb (72.6 kg)    Current Outpatient Medications  Medication Sig Dispense Refill   amoxicillin-clavulanate (AUGMENTIN) 875-125 MG tablet SMARTSIG:1 Tablet(s) By Mouth Every 12 Hours     benzonatate (TESSALON) 100 MG capsule Take 1 capsule (100 mg total) by mouth 3 (three) times daily as needed for cough. Do not take with alcohol or while driving or operating heavy machinery.  May cause drowsiness. 21 capsule 0   chlorpheniramine-HYDROcodone (TUSSIONEX) 10-8 MG/5ML SMARTSIG:5 Milliliter(s) By Mouth Every 12 Hours PRN     etanercept (ENBREL SURECLICK) 50 MG/ML injection Inject 50 mg into the skin every Wednesday.     folic acid (FOLVITE) 400 MCG tablet Take 400 mcg by mouth daily before lunch.     levothyroxine (SYNTHROID) 75 MCG tablet Take 75 mcg by mouth daily before breakfast.     methotrexate 2.5 MG tablet Take 10-12.5 mg by mouth See admin instructions. Take 5 tablets (12.5 mg) by mouth every  Tuesday & take 4 tablets (10 mg) by mouth every Thursday  3   naproxen sodium (ALEVE) 220 MG tablet Take 220 mg by mouth 2 (two) times daily as needed (pain.).     olmesartan (BENICAR) 40 MG tablet Take 40 mg by mouth daily before lunch.     pantoprazole (PROTONIX) 40 MG tablet Take 1 tablet (40 mg total) by mouth daily before breakfast. 90 tablet 3   predniSONE (DELTASONE) 5 MG tablet Take 5 mg by mouth See admin instructions. Take  1 tablet (5 mg) by mouth twice weekly on Tuesdays & Thursdays.  2   Saw Palmetto 450 MG CAPS Take 450 mg by mouth daily before lunch.     SUMAtriptan (IMITREX) 100 MG tablet Take 100 mg by mouth every 2 (two) hours as needed for migraine. For migraines  0   tamsulosin (FLOMAX) 0.4 MG CAPS capsule Take 0.4 mg by mouth daily.     No current facility-administered medications for this visit.    Past Medical History:  Diagnosis Date   GERD (gastroesophageal reflux disease)    Headache    Hypertension    Rheumatoid arthritis (HCC)     Past Surgical History:  Procedure Laterality Date   ANKLE SURGERY Left    CARPAL TUNNEL RELEASE Left    CHOLECYSTECTOMY  2008   COLONOSCOPY N/A 04/12/2016   Dr. Lovell Sheehan: normal.    CYST EXCISION     neck   ESOPHAGOGASTRODUODENOSCOPY N/A 07/29/2018   Dr. Jena Cole: severe erosive/ulcerative esophagitis s/p diltaation, small hiatal hernia, normal duodenum   FOOT ARTHRODESIS Left 02/07/2023   Procedure: LEFT FOOT REMOVAL OF HARDWARE AND REVISION FUSION BASE 1ST METATARSAL;  Surgeon: Nadara Mustard, MD;  Location: [facility] OR;  Service: Orthopedics;  Laterality: Left;   KNEE SURGERY Right    MALONEY DILATION N/A 07/29/2018   Procedure: Elease Hashimoto DILATION;  Surgeon: Corbin Ade, MD;  Location: [facility];  Service: Endoscopy;  Laterality: N/A;    Family History  Problem Relation Age of Onset   Kidney cancer Father    Alcohol abuse Father    Colon cancer Neg Hx    Celiac disease Neg Hx    Inflammatory bowel disease Neg Hx    Colon polyps Neg Hx     Allergies as of 03/24/2024   (No Known Allergies)    Social History   Socioeconomic History   Marital status: Single    Spouse name: Not on file   Number of children: Not on file   Years of education: Not on file   Highest education level: Not on file  Occupational History   Not on file  Tobacco Use   Smoking status: Never   Smokeless tobacco: Never  Vaping Use   Vaping status: Never Used  Substance  and Sexual Activity   Alcohol use: No   Drug use: No   Sexual activity: Not on file  Other Topics Concern   Not on file  Social History Narrative   Not on file   Social Drivers of Health   Financial Resource Strain: Not on file  Food Insecurity: Not on file  Transportation Needs: Not on file  Physical Activity: Not on file  Stress: Not on file  Social Connections: Not on file     Review of Systems   Gen: Denies fever, chills, anorexia. Denies fatigue, weakness, weight loss.  CV: Denies chest pain, palpitations, syncope, peripheral edema, and claudication. Resp: Denies dyspnea at rest, cough, wheezing, coughing up  blood, and pleurisy. GI: See HPI Derm: Denies rash, itching, dry skin Psych: Denies depression, anxiety, memory loss, confusion. No homicidal or suicidal ideation.  Heme: Denies bruising, bleeding, and enlarged lymph nodes.  Physical Exam   There were no vitals taken for this visit.  General:   Alert and oriented. No distress noted. Pleasant and cooperative.  Head:  Normocephalic and atraumatic. Eyes:  Conjuctiva clear without scleral icterus. Mouth:  Oral mucosa pink and moist. Good dentition. No lesions. Lungs:  Clear to auscultation bilaterally. No wheezes, rales, or rhonchi. No distress.  Heart:  S1, S2 present without murmurs appreciated.  Abdomen:  +BS, soft, non-tender and non-distended. No rebound or guarding. No HSM or masses noted. Rectal: *** Msk:  Symmetrical without gross deformities. Normal posture. Extremities:  Without edema. Neurologic:  Alert and  oriented x4 Psych:  Alert and cooperative. Normal mood and affect.  Assessment  Gary Cole is a 68 y.o. male with a history of GERD, hiatal hernia, RA, and HTN presenting today to discuss ongoing constipation.  Constipation:  GERD:  PLAN   ***     Brooke Bonito, MSN, FNP-BC, AGACNP-BC [facility]

## 2024-03-24 ENCOUNTER — Encounter: Payer: Self-pay | Admitting: Gastroenterology

## 2024-03-24 ENCOUNTER — Ambulatory Visit (INDEPENDENT_AMBULATORY_CARE_PROVIDER_SITE_OTHER): Admitting: Gastroenterology

## 2024-03-24 VITALS — BP 125/68 | HR 71 | Temp 97.5°F | Ht 70.0 in | Wt 165.1 lb

## 2024-03-24 DIAGNOSIS — R194 Change in bowel habit: Secondary | ICD-10-CM

## 2024-03-24 DIAGNOSIS — R1030 Lower abdominal pain, unspecified: Secondary | ICD-10-CM

## 2024-03-24 DIAGNOSIS — K219 Gastro-esophageal reflux disease without esophagitis: Secondary | ICD-10-CM

## 2024-03-24 DIAGNOSIS — K5900 Constipation, unspecified: Secondary | ICD-10-CM | POA: Diagnosis not present

## 2024-03-24 NOTE — Patient Instructions (Addendum)
 Please start taking MiraLAX 17 g (1 capful) daily and at least 8 ounces of water.  You can take this either in the morning or at night it does not matter.  I also want you to start taking Metamucil daily.  Please see directions below and your different options including capsules versus powder.    If you take the powder you should take 1 tablespoon once daily in at least 8 oz of water.   If you do not have improvement of your pain or constipation within 2-3 weeks please let us know.  You can send a MyChart message or give the office a call to let me know.  If you do not have improvement we can schedule colonoscopy.  Continue taking pantoprazole daily for reflux.  It was a pleasure to see you today. I want to create trusting relationships with patients. If you receive a survey regarding your visit,  I greatly appreciate you taking time to fill this out on paper or through your MyChart. I value your feedback.  Brooke Bonito, MSN, FNP-BC, AGACNP-BC [facility]

## 2024-03-29 DIAGNOSIS — M9902 Segmental and somatic dysfunction of thoracic region: Secondary | ICD-10-CM | POA: Diagnosis not present

## 2024-03-29 DIAGNOSIS — M62830 Muscle spasm of back: Secondary | ICD-10-CM | POA: Diagnosis not present

## 2024-03-29 DIAGNOSIS — M542 Cervicalgia: Secondary | ICD-10-CM | POA: Diagnosis not present

## 2024-03-29 DIAGNOSIS — M9901 Segmental and somatic dysfunction of cervical region: Secondary | ICD-10-CM | POA: Diagnosis not present

## 2024-03-29 DIAGNOSIS — M9903 Segmental and somatic dysfunction of lumbar region: Secondary | ICD-10-CM | POA: Diagnosis not present

## 2024-03-29 DIAGNOSIS — M546 Pain in thoracic spine: Secondary | ICD-10-CM | POA: Diagnosis not present

## 2024-04-01 DIAGNOSIS — M7989 Other specified soft tissue disorders: Secondary | ICD-10-CM | POA: Diagnosis not present

## 2024-04-01 DIAGNOSIS — M0609 Rheumatoid arthritis without rheumatoid factor, multiple sites: Secondary | ICD-10-CM | POA: Diagnosis not present

## 2024-04-01 DIAGNOSIS — R5382 Chronic fatigue, unspecified: Secondary | ICD-10-CM | POA: Diagnosis not present

## 2024-04-02 ENCOUNTER — Telehealth: Payer: Self-pay | Admitting: *Deleted

## 2024-04-02 NOTE — Telephone Encounter (Signed)
 Pt called and states that he is still having problems with his stomach. He states that he wants to have a colonoscopy.

## 2024-04-05 ENCOUNTER — Encounter: Payer: Self-pay | Admitting: *Deleted

## 2024-04-05 ENCOUNTER — Other Ambulatory Visit: Payer: Self-pay | Admitting: *Deleted

## 2024-04-05 MED ORDER — PEG 3350-KCL-NA BICARB-NACL 420 G PO SOLR: 4000.0000 mL | Freq: Once | ORAL | 0 refills | Status: AC

## 2024-04-05 NOTE — Telephone Encounter (Signed)
 Noted.

## 2024-04-05 NOTE — Telephone Encounter (Signed)
 Pt has been scheduled for 04/22/24. Instructions mailed and prep sent to the pharmacy

## 2024-04-12 DIAGNOSIS — M9903 Segmental and somatic dysfunction of lumbar region: Secondary | ICD-10-CM | POA: Diagnosis not present

## 2024-04-12 DIAGNOSIS — M62830 Muscle spasm of back: Secondary | ICD-10-CM | POA: Diagnosis not present

## 2024-04-12 DIAGNOSIS — M9901 Segmental and somatic dysfunction of cervical region: Secondary | ICD-10-CM | POA: Diagnosis not present

## 2024-04-12 DIAGNOSIS — M9902 Segmental and somatic dysfunction of thoracic region: Secondary | ICD-10-CM | POA: Diagnosis not present

## 2024-04-12 DIAGNOSIS — M546 Pain in thoracic spine: Secondary | ICD-10-CM | POA: Diagnosis not present

## 2024-04-12 DIAGNOSIS — M542 Cervicalgia: Secondary | ICD-10-CM | POA: Diagnosis not present

## 2024-04-15 DIAGNOSIS — H04123 Dry eye syndrome of bilateral lacrimal glands: Secondary | ICD-10-CM | POA: Diagnosis not present

## 2024-04-19 ENCOUNTER — Ambulatory Visit
Admission: EM | Admit: 2024-04-19 | Discharge: 2024-04-19 | Disposition: A | Discharge status: Home / Self Care | Attending: Family Medicine | Admitting: Family Medicine

## 2024-04-19 DIAGNOSIS — J069 Acute upper respiratory infection, unspecified: Secondary | ICD-10-CM | POA: Diagnosis not present

## 2024-04-19 LAB — POC COVID19/FLU A&B COMBO
Covid Antigen, POC: NEGATIVE
Influenza A Antigen, POC: NEGATIVE
Influenza B Antigen, POC: NEGATIVE

## 2024-04-19 MED ORDER — AZELASTINE HCL 0.1 % NA SOLN: 1.0000 | Freq: Two times a day (BID) | NASAL | 0 refills | Status: AC

## 2024-04-19 MED ORDER — PROMETHAZINE-DM 6.25-15 MG/5ML PO SYRP: 5.0000 mL | ORAL_SOLUTION | Freq: Four times a day (QID) | ORAL | 0 refills | Status: AC | PRN

## 2024-04-19 NOTE — Discharge Instructions (Signed)
 I have sent over a cough syrup and a dry nasal spray to help with your symptoms.  You may also take Mucinex, Coricidin HBP, Flonase, use saline sinus rinses and humidifiers.  Follow-up for significantly worsening symptoms.  COVID and flu were negative today.

## 2024-04-19 NOTE — ED Triage Notes (Signed)
 Pt reports cough, nasal congestion, nasal drainage, back pain between shoulder blades, and in lower back when coughing.

## 2024-04-20 NOTE — ED Provider Notes (Signed)
 [facility]    CSN: [id number] Arrival date & time: 04/19/24  1853      History   Chief Complaint No chief complaint on file.   HPI Gary Cole is a 68 y.o. male.   Patient presenting today with several day history of cough, congestion, back pain with coughing.  Denies fever, chills, chest pain, shortness of breath, abdominal pain, vomiting, diarrhea.  So far trying over-the-counter remedies with minimal relief.  Multiple sick contacts recently at work.  No known history of chronic pulmonary disease.    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 Larrie Po: normal.    CYST EXCISION     neck   ESOPHAGOGASTRODUODENOSCOPY N/A 07/29/2018   Dr. Riley Cheadle: severe erosive/ulcerative esophagitis s/p diltaation, small hiatal hernia, normal duodenum   FOOT ARTHRODESIS Left 02/07/2023   Procedure: LEFT FOOT REMOVAL OF HARDWARE AND REVISION FUSION BASE 1ST METATARSAL;  Surgeon: Timothy Ford, MD;  Location: [facility] OR;  Service: Orthopedics;  Laterality: Left;   KNEE SURGERY Right    MALONEY DILATION N/A 07/29/2018   Procedure: Londa Rival DILATION;  Surgeon: Suzette Espy, MD;  Location: [facility];  Service: Endoscopy;  Laterality: N/A;       Home Medications    Prior to Admission medications   Medication Sig Start Date End Date Taking? Authorizing Provider  azelastine (ASTELIN) 0.1 % nasal spray Place 1 spray into  both nostrils 2 (two) times daily. Use in each nostril as directed 04/19/24  Yes Corbin Dess, PA-C  promethazine -dextromethorphan (PROMETHAZINE -DM) 6.25-15 MG/5ML syrup Take 5 mLs by mouth 4 (four) times daily as needed. 04/19/24  Yes Corbin Dess, PA-C  etanercept (ENBREL SURECLICK) 50 MG/ML injection Inject 50 mg into the skin every Wednesday.    [provider]  folic acid (FOLVITE) 400 MCG tablet Take 400 mcg by mouth daily before lunch.    [provider]  levothyroxine (SYNTHROID) 75 MCG tablet Take 75 mcg by mouth daily before breakfast.    [provider]  methotrexate 2.5 MG tablet Take 10-12.5 mg by mouth See admin instructions. Take 5 tablets (12.5 mg) by mouth every Tuesday & take 4 tablets (10 mg) by mouth every Thursday 06/16/18   [provider]  naproxen sodium (ALEVE) 220 MG tablet Take 220 mg by mouth 2 (two) times daily as needed (pain.).    [provider]  olmesartan (BENICAR) 40 MG tablet Take 40 mg by mouth daily before lunch. 01/13/23   [provider]  pantoprazole  (PROTONIX ) 40 MG tablet Take 1 tablet (40 mg total) by mouth daily before breakfast. 12/01/23   Eustacio Highman, NP  predniSONE  (DELTASONE ) 5 MG tablet Take 5 mg by mouth See admin instructions. Take 1 tablet (5 mg) by mouth twice weekly on Tuesdays & Thursdays. 05/03/18   [provider]  Saw Palmetto 450  MG CAPS Take 450 mg by mouth daily before lunch.    [provider]  SUMAtriptan (IMITREX) 100 MG tablet Take 100 mg by mouth every 2 (two) hours as needed for migraine. For migraines 03/26/16   [provider]  tamsulosin (FLOMAX) 0.4 MG CAPS capsule Take 0.4 mg by mouth daily.    [provider]    Family History Family History  Problem Relation Age of Onset   Kidney cancer Father    Alcohol abuse Father    Colon cancer Neg Hx    Celiac disease Neg Hx    Inflammatory bowel disease Neg Hx    Colon  polyps Neg Hx     Social History Social History   Tobacco Use   Smoking status: Never   Smokeless tobacco: Never  Vaping Use   Vaping status: Never Used  Substance Use Topics   Alcohol use: No   Drug use: No     Allergies   Patient has no known allergies.   Review of Systems Review of Systems Per HPI  Physical Exam Triage Vital Signs ED Triage Vitals  Encounter Vitals Group     BP 04/19/24 1859 136/76     Systolic BP Percentile --      Diastolic BP Percentile --      Pulse Rate 04/19/24 1859 84     Resp 04/19/24 1859 18     Temp 04/19/24 1859 98.3 F (36.8 C)     Temp Source 04/19/24 1859 Oral     SpO2 04/19/24 1859 95 %     Weight --      Height --      Head Circumference --      Peak Flow --      Pain Score 04/19/24 1900 0     Pain Loc --      Pain Education --      Exclude from Growth Chart --    No data found.  Updated Vital Signs BP 136/76 (BP Location: Right Arm)   Pulse 84   Temp 98.3 F (36.8 C) (Oral)   Resp 18   SpO2 95%   Visual Acuity Right Eye Distance:   Left Eye Distance:   Bilateral Distance:    Right Eye Near:   Left Eye Near:    Bilateral Near:     Physical Exam Vitals and nursing note reviewed.  Constitutional:      Appearance: He is well-developed.  HENT:     Head: Atraumatic.     Right Ear: External ear normal.     Left Ear: External ear normal.     Nose: Rhinorrhea present.     Mouth/Throat:     Mouth: Mucous membranes are moist.     Pharynx: Oropharynx is clear. Posterior oropharyngeal erythema present. No oropharyngeal exudate.  Eyes:     Conjunctiva/sclera: Conjunctivae normal.     Pupils: Pupils are equal, round, and reactive to light.  Cardiovascular:     Rate and Rhythm: Normal rate and regular rhythm.  Pulmonary:     Effort: Pulmonary effort is normal. No respiratory distress.     Breath sounds: No wheezing or rales.  Musculoskeletal:        General: Normal range of motion.     Cervical back: Normal  range of motion and neck supple.  Lymphadenopathy:     Cervical: No cervical adenopathy.  Skin:    General: Skin is warm and dry.  Neurological:     Mental  Status: He is alert and oriented to person, place, and time.  Psychiatric:        Behavior: Behavior normal.      UC Treatments / Results  Labs (all labs ordered are listed, but only abnormal results are displayed) Labs Reviewed  POC COVID19/FLU A&B COMBO    EKG   Radiology No results found.  Procedures Procedures (including critical care time)  Medications Ordered in UC Medications - No data to display  Initial Impression / Assessment and Plan / UC Course  I have reviewed the triage vital signs and the nursing notes.  Pertinent labs & imaging results that were available during my care of the patient were reviewed by me and considered in my medical decision making (see chart for details).     Vitals and exam reassuring today and suggestive of a viral respiratory infection.  Rapid flu and COVID-negative, treat with Phenergan  DM, Astelin nasal spray, supportive over-the-counter medications and home care.  Return for worsening symptoms.  Final Clinical Impressions(s) / UC Diagnoses   Final diagnoses:  Viral URI with cough     Discharge Instructions      I have sent over a cough syrup and a dry nasal spray to help with your symptoms.  You may also take Mucinex, Coricidin HBP, Flonase, use saline sinus rinses and humidifiers.  Follow-up for significantly worsening symptoms.  COVID and flu were negative today.     ED Prescriptions     Medication Sig Dispense Auth. Provider   promethazine -dextromethorphan (PROMETHAZINE -DM) 6.25-15 MG/5ML syrup Take 5 mLs by mouth 4 (four) times daily as needed. 100 mL Corbin Dess, PA-C   azelastine (ASTELIN) 0.1 % nasal spray Place 1 spray into both nostrils 2 (two) times daily. Use in each nostril as directed 30 mL Corbin Dess, PA-C      PDMP not  reviewed this encounter.   Corbin Dess, [place] 04/20/24 [phone]

## 2024-04-22 ENCOUNTER — Ambulatory Visit (HOSPITAL_COMMUNITY)
Admission: RE | Admit: 2024-04-22 | Discharge: 2024-04-22 | Disposition: A | Discharge status: Home / Self Care | Attending: Internal Medicine | Admitting: Internal Medicine

## 2024-04-22 ENCOUNTER — Telehealth: Payer: Self-pay

## 2024-04-22 ENCOUNTER — Ambulatory Visit (HOSPITAL_COMMUNITY): Admitting: Registered Nurse

## 2024-04-22 ENCOUNTER — Encounter (HOSPITAL_COMMUNITY): Payer: Self-pay | Admitting: Internal Medicine

## 2024-04-22 ENCOUNTER — Other Ambulatory Visit: Payer: Self-pay

## 2024-04-22 ENCOUNTER — Encounter (HOSPITAL_COMMUNITY)
Admission: RE | Disposition: A | Discharge status: Home / Self Care | Payer: Self-pay | Source: Home / Self Care | Attending: Internal Medicine

## 2024-04-22 DIAGNOSIS — K5140 Inflammatory polyps of colon without complications: Secondary | ICD-10-CM | POA: Diagnosis not present

## 2024-04-22 DIAGNOSIS — R1084 Generalized abdominal pain: Secondary | ICD-10-CM

## 2024-04-22 DIAGNOSIS — I10 Essential (primary) hypertension: Secondary | ICD-10-CM | POA: Diagnosis not present

## 2024-04-22 DIAGNOSIS — K5900 Constipation, unspecified: Secondary | ICD-10-CM | POA: Diagnosis not present

## 2024-04-22 DIAGNOSIS — K219 Gastro-esophageal reflux disease without esophagitis: Secondary | ICD-10-CM | POA: Insufficient documentation

## 2024-04-22 DIAGNOSIS — K635 Polyp of colon: Secondary | ICD-10-CM | POA: Diagnosis not present

## 2024-04-22 HISTORY — PX: COLONOSCOPY: SHX5424

## 2024-04-22 SURGERY — COLONOSCOPY
Anesthesia: General

## 2024-04-22 MED ORDER — GLYCOPYRROLATE PF 0.2 MG/ML IJ SOSY
PREFILLED_SYRINGE | INTRAMUSCULAR | Status: DC | PRN
  Administered 2024-04-22: .2 mg via INTRAVENOUS

## 2024-04-22 MED ORDER — LINACLOTIDE 145 MCG PO CAPS: 145.0000 ug | ORAL_CAPSULE | Freq: Every day | ORAL | 11 refills | Status: AC

## 2024-04-22 MED ORDER — LACTATED RINGERS IV SOLN: INTRAVENOUS | Status: DC | PRN

## 2024-04-22 MED ORDER — PROPOFOL 10 MG/ML IV BOLUS
INTRAVENOUS | Status: DC | PRN
Start: 2024-04-22 — End: 2024-04-22
  Administered 2024-04-22: 60 mg via INTRAVENOUS
  Administered 2024-04-22: 100 ug/kg/min via INTRAVENOUS

## 2024-04-22 MED ORDER — LIDOCAINE 2% (20 MG/ML) 5 ML SYRINGE
INTRAMUSCULAR | Status: DC | PRN
  Administered 2024-04-22: 60 mg via INTRAVENOUS

## 2024-04-22 MED ORDER — EPHEDRINE SULFATE-NACL 50-0.9 MG/10ML-% IV SOSY
PREFILLED_SYRINGE | INTRAVENOUS | Status: DC | PRN
  Administered 2024-04-22: 10 mg via INTRAVENOUS

## 2024-04-22 NOTE — Discharge Instructions (Signed)
  Colonoscopy Discharge Instructions  Read the instructions outlined below and refer to this sheet in the next few weeks. These discharge instructions provide you with general information on caring for yourself after you leave the hospital. Your doctor may also give you specific instructions. While your treatment has been planned according to the most current medical practices available, unavoidable complications occasionally occur. If you have any problems or questions after discharge, call Dr. Riley Cheadle at [phone]. ACTIVITY You may resume your regular activity, but move at a slower pace for the next 24 hours.  Take frequent rest periods for the next 24 hours.  Walking will help get rid of the air and reduce the bloated feeling in your belly (abdomen).  No driving for 24 hours (because of the medicine (anesthesia) used during the test).   Do not sign any important legal documents or operate any machinery for 24 hours (because of the anesthesia used during the test).  NUTRITION Drink plenty of fluids.  You may resume your normal diet as instructed by your doctor.  Begin with a light meal and progress to your normal diet. Heavy or fried foods are harder to digest and may make you feel sick to your stomach (nauseated).  Avoid alcoholic beverages for 24 hours or as instructed.  MEDICATIONS You may resume your normal medications unless your doctor tells you otherwise.  WHAT YOU CAN EXPECT TODAY Some feelings of bloating in the abdomen.  Passage of more gas than usual.  Spotting of blood in your stool or on the toilet paper.  IF YOU HAD POLYPS REMOVED DURING THE COLONOSCOPY: No aspirin products for 7 days or as instructed.  No alcohol for 7 days or as instructed.  Eat a soft diet for the next 24 hours.  FINDING OUT THE RESULTS OF YOUR TEST Not all test results are available during your visit. If your test results are not back during the visit, make an appointment with your caregiver to find out the  results. Do not assume everything is normal if you have not heard from your caregiver or the medical facility. It is important for you to follow up on all of your test results.  SEEK IMMEDIATE MEDICAL ATTENTION IF: You have more than a spotting of blood in your stool.  Your belly is swollen (abdominal distention).  You are nauseated or vomiting.  You have a temperature over 101.  You have abdominal pain or discomfort that is severe or gets worse throughout the day.      1 small polyp found and removed     colon otherwise appeared normal.  Stop MiraLAX  New prescription for constipation-Linzess  145 gelcap daily -  new prescription already called into the office.  Office visit with us  in 3 months

## 2024-04-22 NOTE — Anesthesia Preprocedure Evaluation (Signed)
 Anesthesia Evaluation  Patient identified by MRN, date of birth, ID band Patient awake    Reviewed: Allergy & Precautions, H&P , NPO status , Patient's Chart, lab work & pertinent test results, reviewed documented beta blocker date and time   Airway Mallampati: II  TM Distance: >3 FB Neck ROM: full    Dental no notable dental hx. (+) Dental Advisory Given, Teeth Intact   Pulmonary neg pulmonary ROS   Pulmonary exam normal breath sounds clear to auscultation       Cardiovascular Exercise Tolerance: Good hypertension, Normal cardiovascular exam Rhythm:regular Rate:Normal     Neuro/Psych  Headaches  negative psych ROS   GI/Hepatic Neg liver ROS,GERD  ,,  Endo/Other  negative endocrine ROS    Renal/GU negative Renal ROS  negative genitourinary   Musculoskeletal  (+) Arthritis , Rheumatoid disorders,    Abdominal   Peds  Hematology negative hematology ROS (+)   Anesthesia Other Findings   Reproductive/Obstetrics negative OB ROS                             Anesthesia Physical Anesthesia Plan  ASA: 2  Anesthesia Plan: General   Post-op Pain Management: Minimal or no pain anticipated   Induction: Intravenous  PONV Risk Score and Plan: Propofol  infusion  Airway Management Planned: Natural Airway and Nasal Cannula  Additional Equipment: None  Intra-op Plan:   Post-operative Plan:   Informed Consent: I have reviewed the patients History and Physical, chart, labs and discussed the procedure including the risks, benefits and alternatives for the proposed anesthesia with the patient or authorized representative who has indicated his/her understanding and acceptance.     Dental Advisory Given  Plan Discussed with: CRNA  Anesthesia Plan Comments:        Anesthesia Quick Evaluation

## 2024-04-22 NOTE — Interval H&P Note (Signed)
 History and Physical Interval Note:  04/22/2024 10:12 AM  Gary Cole  has presented today for surgery, with the diagnosis of change in bowel habits, lower abdominal pain..  The various methods of treatment have been discussed with the patient and family. After consideration of risks, benefits and other options for treatment, the patient has consented to  Procedure(s) with comments: COLONOSCOPY (N/A) - 10:15 AM, ASA 2 as a surgical intervention.  The patient's history has been reviewed, patient examined, no change in status, stable for surgery.  I have reviewed the patient's chart and labs.  Questions were answered to the patient's satisfaction.     Gary Cole  Change in bowel habits lower abdominal pain negative CT recently diagnostic colonoscopy today per plan. The risks, benefits, limitations, alternatives and imponderables have been reviewed with the patient. Questions have been answered. All parties are agreeable.

## 2024-04-22 NOTE — Transfer of Care (Signed)
 Immediate Anesthesia Transfer of Care Note  Patient: Gary Cole  Procedure(s) Performed: COLONOSCOPY  Patient Location: PACU  Anesthesia Type:General  Level of Consciousness: awake, alert , and oriented  Airway & Oxygen Therapy: Patient Spontanous Breathing  Post-op Assessment: Report given to RN and Post -op Vital signs reviewed and stable  Post vital signs: Reviewed and stable  Last Vitals:  Vitals Value Taken Time  BP 97/45 04/22/24 1044  Temp 36.4 C 04/22/24 1044  Pulse 56 04/22/24 1044  Resp 18 04/22/24 1044  SpO2 96 % 04/22/24 1044    Last Pain:  Vitals:   04/22/24 1044  TempSrc: Oral  PainSc: 0-No pain      Patients Stated Pain Goal: 5 (04/22/24 0902)  Complications: No notable events documented.

## 2024-04-22 NOTE — Telephone Encounter (Signed)
-----   Message from Garnette Ka sent at 04/22/2024 10:43 AM EDT -----   New prescription for Linzess  145.  Take 1 gelcap daily.  Dispense 30 with 11 refills.

## 2024-04-22 NOTE — Interval H&P Note (Signed)
 History and Physical Interval Note:  04/22/2024 10:18 AM  Gary Cole  has presented today for surgery, with the diagnosis of change in bowel habits, lower abdominal pain..  The various methods of treatment have been discussed with the patient and family. After consideration of risks, benefits and other options for treatment, the patient has consented to  Procedure(s) with comments: COLONOSCOPY (N/A) - 10:15 AM, ASA 2 as a surgical intervention.  The patient's history has been reviewed, patient examined, no change in status, stable for surgery.  I have reviewed the patient's chart and labs.  Questions were answered to the patient's satisfaction.     [name]  No change in lower abdominal pain with MiraLAX continues to be constipated interestingly, he follow prep instructions got a good cleanout for this colonoscopy and his abdominal pain has totally resolved.  Agree with need for diagnostic colonoscopy today.  The risks, benefits, limitations, alternatives and imponderables have been reviewed with the patient. Questions have been answered. All parties are agreeable.

## 2024-04-22 NOTE — Anesthesia Postprocedure Evaluation (Signed)
 Anesthesia Post Note  Patient: DONTERRIO MURDOUGH  Procedure(s) Performed: COLONOSCOPY  Patient location during evaluation: Endoscopy Anesthesia Type: General Level of consciousness: awake and alert Pain management: pain level controlled Vital Signs Assessment: post-procedure vital signs reviewed and stable Respiratory status: spontaneous breathing, nonlabored ventilation and respiratory function stable Cardiovascular status: blood pressure returned to baseline and stable Postop Assessment: no apparent nausea or vomiting Anesthetic complications: no   There were no known notable events for this encounter.   Last Vitals:  Vitals:   04/22/24 0902 04/22/24 1044  BP: 128/72 (!) 97/45  Pulse: (!) 53 (!) 56  Resp: 18 18  Temp: 36.6 C 36.4 C  SpO2: 100% 96%    Last Pain:  Vitals:   04/22/24 1044  TempSrc: Oral  PainSc: 0-No pain                 Nazaret Chea L Teruko Joswick

## 2024-04-23 NOTE — Op Note (Signed)
 [facility] Patient Name: Gary Cole Procedure Date: 04/22/2024 10:12 AM MRN: [id number] Date of Birth: [birth year] Attending MD: Gemma Kelp , MD, [id number] CSN: [id number] Age: 68 Admit Type: Outpatient Procedure:                Colonoscopy Indications:              Generalized abdominal pain; constipation Providers:                Gemma Kelp, MD, Troy Furnish. Hazeline Lister RN, RN,                            Sharlette Dayhoff Technician, Technician, [place] Wilson,                            Technician Referring MD:              Medicines:                Propofol  per Anesthesia Complications:            No immediate complications. Estimated Blood Loss:     Estimated blood loss was minimal. Procedure:                Pre-Anesthesia Assessment:                           - Prior to the procedure, a History and Physical                            was performed, and patient medications and                            allergies were reviewed. The patient's tolerance of                            previous anesthesia was also reviewed. The risks                            and benefits of the procedure and the sedation                            options and risks were discussed with the patient.                            All questions were answered, and informed consent                            was obtained. Prior Anticoagulants: The patient has                            taken no anticoagulant or antiplatelet agents. ASA                            Grade Assessment: II - A patient with mild systemic  disease. After reviewing the risks and benefits,                            the patient was deemed in satisfactory condition to                            undergo the procedure.                           After obtaining informed consent, the colonoscope                            was passed under direct vision. Throughout the                             procedure, the patient's blood pressure, pulse, and                            oxygen saturations were monitored continuously. The                            [id number]) scope was introduced through the                            anus and advanced to the the cecum, identified by                            appendiceal orifice and ileocecal valve. The                            colonoscopy was performed without difficulty. The                            patient tolerated the procedure well. The quality                            of the bowel preparation was adequate. The                            ileocecal valve, appendiceal orifice, and rectum                            were photographed. Scope In: 10:28:10 AM Scope Out: 10:39:45 AM Scope Withdrawal Time: 0 hours 8 minutes 3 seconds  Total Procedure Duration: 0 hours 11 minutes 35 seconds  Findings:      The perianal and digital rectal examinations were normal.      A 3 mm polyp was found in the ascending colon. The polyp was sessile.       The polyp was removed with a cold snare. Resection and retrieval were       complete. Estimated blood loss was minimal.      The exam was otherwise without abnormality on direct and retroflexion       views. Impression:               -  One 3 mm polyp in the ascending colon, removed                            with a cold snare. Resected and retrieved.                           - The examination was otherwise normal on direct                            and retroflexion views.                           - Miralax has not worked for the patient. He he had                            a great prep today and states after he completed                            his prep he had no abdominal comfort whatsoever. Moderate Sedation:      Moderate (conscious) sedation was personally administered by an       anesthesia professional. The following parameters were monitored: oxygen       saturation, heart rate,  blood pressure, respiratory rate, EKG, adequacy       of pulmonary ventilation, and response to care. Recommendation:           - Patient has a contact number available for                            emergencies. The signs and symptoms of potential                            delayed complications were discussed with the                            patient. Return to normal activities tomorrow.                            Written discharge instructions were provided to the                            patient.                           - Advance diet as tolerated.                           - Continue present medications.                           - Repeat colonoscopy date to be determined after                            pending pathology results are reviewed for  surveillance.                           - Return to GI office in 3 months. Stop MiraLAX                            begin Linzess  145 gelcap daily Procedure Code(s):        --- Professional ---                           [phone], Colonoscopy, flexible; with removal of                            tumor(s), polyp(s), or other lesion(s) by snare                            technique Diagnosis Code(s):        --- Professional ---                           D12.2, Benign neoplasm of ascending colon                           R10.84, Generalized abdominal pain CPT copyright 2022 [employer]. All rights reserved. The codes documented in this report are preliminary and upon coder review may  be revised to meet current compliance requirements. Windsor Hatcher. Lateshia Schmoker, MD Gemma Kelp, MD 04/23/2024 9:15:45 AM This report has been signed electronically. Number of Addenda: 0

## 2024-04-26 ENCOUNTER — Encounter (HOSPITAL_COMMUNITY): Payer: Self-pay | Admitting: Internal Medicine

## 2024-04-27 DIAGNOSIS — Z0001 Encounter for general adult medical examination with abnormal findings: Secondary | ICD-10-CM | POA: Diagnosis not present

## 2024-04-27 DIAGNOSIS — M069 Rheumatoid arthritis, unspecified: Secondary | ICD-10-CM | POA: Diagnosis not present

## 2024-04-27 DIAGNOSIS — E039 Hypothyroidism, unspecified: Secondary | ICD-10-CM | POA: Diagnosis not present

## 2024-04-27 DIAGNOSIS — I10 Essential (primary) hypertension: Secondary | ICD-10-CM | POA: Diagnosis not present

## 2024-04-27 DIAGNOSIS — Z6823 Body mass index (BMI) 23.0-23.9, adult: Secondary | ICD-10-CM | POA: Diagnosis not present

## 2024-04-28 LAB — SURGICAL PATHOLOGY

## 2024-04-29 ENCOUNTER — Encounter: Payer: Self-pay | Admitting: Internal Medicine

## 2024-05-04 NOTE — Progress Notes (Unsigned)
 GI Office Note    Referring Provider: Minus Amel, MD Primary Care Physician:  Minus Amel, MD Primary Gastroenterologist: Windsor Hatcher.Rourk, MD  Date:  05/05/2024  ID:  Gary Cole, DOB 1956-04-24, MRN [id number]   Chief Complaint   Chief Complaint  Patient presents with   Constipation    Follow up on constipation. Taking linzess  daily and states it is working well.    Gastroesophageal Reflux    Follow up on GERD. Takes pantoprazole  and states it is working well.    History of Present Illness  MOMAR SICURELLA is a 68 y.o. male with a history of constipation, GERD, hiatal hernia, RA, and hypertension presenting today for follow-up of reflux and constipation.  EGD August 2019: -Severe erosive/ulcerative esophagitis s/p dilation -Small hiatal hernia -Normal duodenum -No evidence of tumor, lymph node seen on recent CT possibly reactive -Began Protonix  40 mg twice daily -Consider repeat CT of GE junction in 3 months.   Colonoscopy April 2017: -Entire examined colon normal.  No specimens collected. -Repeat colonoscopy in 10 years.   OV by Dr. Riley Cheadle 01/28/23.  Presented with a 1 week history of gas bloating as well as lower abdominal pain. Reportedly symptoms of abdominal pain have resolved.  Having 1 good stool daily.  No blood or melena. Denied any constipation or diarrhea.  History of complicated reflux esophagitis with stricture dilated previously.  No evidence of Barrett's esophagus.  Symptoms controlled with Protonix  40 mg daily.  Gas bloat symptoms nicely responded to fiber supplementation and probiotic.  Recently lost fianc to colon cancer.  Plan for average risk screening colonoscopy in 2027.  Advised to continue Protonix  40 mg daily 30 minutes before breakfast.  Advised we will check for stool occult if positive schedule colonoscopy now. Fecal occult was negative.   Labs and February 2024 with stable hemoglobin and BMP.  OV  12/01/23.  Denied any overt diarrhea or  abdominal pain.  Has mild intermittent constipation but felt this is secondary to steroids, antibiotics, and cough syrup with codeine for recent URI.  Prior to this no issues with constipation.  Reflux 7 doing well in the prior 6 months and not had any breakthrough symptoms.  Reported some weight gain since last visit.  Advise MiraLAX as needed for constipation and could take on a daily basis if he continues to have constipation despite discontinuation of codeine.  Advised to continue pantoprazole  40 mg once daily.  Discussed weaning, advised if he were to like to wean in the future then to decrease his pantoprazole  to 40 mg once daily.  Follow-up 1 year.  Last office visit 03/24/24.  GERD well-controlled with pantoprazole , denied nausea or vomiting.  Biggest complaint has been lower abdominal pain as well as constipation.  Tried stool softeners and milk of magnesia but had not tried any MiraLAX.  Always hurts in the lower abdomen has a tight feeling but not an urgency.  The day prior he reported 3 bowel movements, used to go daily but sometimes had only been going once a week for the last month or so.  Abdominal pain does improve after bowel movements.  Admits to likely chronic dehydration.  [place] stools 1-4 varying.  Advised MiraLAX 1 capful daily as well as Metamucil 1 tablespoon daily if after 6 weeks he is continuing to have issues with constipation then can consider colonoscopy.  Continue pantoprazole  daily  Colonoscopy 04/23/2024: - 3 mm ascending colon polyp - Exam otherwise normal - Patient reported after completing  his colon prep he had no abdominal discomfort, was not having improvement with MiraLAX. - Dr. Riley Cheadle started him on Linzess  145 mcg daily  Today:  GERD - controlled on pantoprazole  40 mg daily.  Denies nausea, vomiting, dysphagia  Constipation - First few days he had significant diarrhea but currently he is taking Linzess  every other day for about a week and a half. Having soft formed  stools and a BM is everyday - at least one sometimes 2. No longer having abdominal discomfort.  Denies melena or BRBPR.  Left lower quadrant abdominal pain completely resolved.  Wt Readings from Last 3 Encounters:  05/05/24 167 lb 1.6 oz (75.8 kg)  04/22/24 165 lb (74.8 kg)  03/24/24 165 lb 1.6 oz (74.9 kg)    Current Outpatient Medications  Medication Sig Dispense Refill   azelastine  (ASTELIN ) 0.1 % nasal spray Place 1 spray into both nostrils 2 (two) times daily. Use in each nostril as directed 30 mL 0   etanercept (ENBREL SURECLICK) 50 MG/ML injection Inject 50 mg into the skin every Wednesday.     folic acid (FOLVITE) 400 MCG tablet Take 400 mcg by mouth daily before lunch.     levothyroxine (SYNTHROID) 75 MCG tablet Take 75 mcg by mouth daily before breakfast.     linaclotide  (LINZESS ) 145 MCG CAPS capsule Take 1 capsule (145 mcg total) by mouth daily before breakfast. 30 capsule 11   methotrexate 2.5 MG tablet Take 10-12.5 mg by mouth See admin instructions. Take 5 tablets (12.5 mg) by mouth every Tuesday & take 4 tablets (10 mg) by mouth every Thursday  3   naproxen sodium (ALEVE) 220 MG tablet Take 220 mg by mouth 2 (two) times daily as needed (pain.).     olmesartan (BENICAR) 40 MG tablet Take 40 mg by mouth daily before lunch.     pantoprazole  (PROTONIX ) 40 MG tablet Take 1 tablet (40 mg total) by mouth daily before breakfast. 90 tablet 3   predniSONE  (DELTASONE ) 5 MG tablet Take 5 mg by mouth See admin instructions. Take 1 tablet (5 mg) by mouth twice weekly on Tuesdays & Thursdays.  2   promethazine -dextromethorphan (PROMETHAZINE -DM) 6.25-15 MG/5ML syrup Take 5 mLs by mouth 4 (four) times daily as needed. 100 mL 0   Saw Palmetto 450 MG CAPS Take 450 mg by mouth daily before lunch.     SUMAtriptan (IMITREX) 100 MG tablet Take 100 mg by mouth every 2 (two) hours as needed for migraine. For migraines  0   tamsulosin (FLOMAX) 0.4 MG CAPS capsule Take 0.4 mg by mouth daily.     No  current facility-administered medications for this visit.    Past Medical History:  Diagnosis Date   GERD (gastroesophageal reflux disease)    Headache    Hypertension    Rheumatoid arthritis (HCC)     Past Surgical History:  Procedure Laterality Date   ANKLE SURGERY Left    CARPAL TUNNEL RELEASE Left    CHOLECYSTECTOMY  2008   COLONOSCOPY N/A 04/12/2016   Dr. Larrie Po: normal.    COLONOSCOPY N/A 04/22/2024   Procedure: COLONOSCOPY;  Surgeon: Suzette Espy, MD;  Location: [facility];  Service: Endoscopy;  Laterality: N/A;  10:15 AM, ASA 2   CYST EXCISION     neck   ESOPHAGOGASTRODUODENOSCOPY N/A 07/29/2018   Dr. Riley Cheadle: severe erosive/ulcerative esophagitis s/p diltaation, small hiatal hernia, normal duodenum   FOOT ARTHRODESIS Left 02/07/2023   Procedure: LEFT FOOT REMOVAL OF HARDWARE AND REVISION FUSION  BASE 1ST METATARSAL;  Surgeon: Timothy Ford, MD;  Location: [facility];  Service: Orthopedics;  Laterality: Left;   KNEE SURGERY Right    MALONEY DILATION N/A 07/29/2018   Procedure: Londa Rival DILATION;  Surgeon: Suzette Espy, MD;  Location: [facility];  Service: Endoscopy;  Laterality: N/A;    Family History  Problem Relation Age of Onset   Kidney cancer Father    Alcohol abuse Father    Colon cancer Neg Hx    Celiac disease Neg Hx    Inflammatory bowel disease Neg Hx    Colon polyps Neg Hx     Allergies as of 05/05/2024   (No Known Allergies)    Social History   Socioeconomic History   Marital status: Single    Spouse name: Not on file   Number of children: Not on file   Years of education: Not on file   Highest education level: Not on file  Occupational History   Not on file  Tobacco Use   Smoking status: Never   Smokeless tobacco: Never  Vaping Use   Vaping status: Never Used  Substance and Sexual Activity   Alcohol use: No   Drug use: No   Sexual activity: Not on file  Other Topics Concern   Not on file  Social History Narrative   Not on file    Social Drivers of Health   Financial Resource Strain: Not on file  Food Insecurity: Not on file  Transportation Needs: Not on file  Physical Activity: Not on file  Stress: Not on file  Social Connections: Not on file     Review of Systems   Gen: Denies fever, chills, anorexia. Denies fatigue, weakness, weight loss.  CV: Denies chest pain, palpitations, syncope, peripheral edema, and claudication. Resp: Denies dyspnea at rest, cough, wheezing, coughing up blood, and pleurisy. GI: See HPI Derm: Denies rash, itching, dry skin Psych: Denies depression, anxiety, memory loss, confusion. No homicidal or suicidal ideation.  Heme: Denies bruising, bleeding, and enlarged lymph nodes.  Physical Exam   BP 123/71   Pulse 66   Temp 97.8 F (36.6 C)   Ht 5\' 10"  (1.778 m)   Wt 167 lb 1.6 oz (75.8 kg)   BMI 23.98 kg/m   General:   Alert and oriented. No distress noted. Pleasant and cooperative.  Head:  Normocephalic and atraumatic. Eyes:  Conjuctiva clear without scleral icterus. Mouth:  Oral mucosa pink and moist. Good dentition. No lesions. Abdomen:  +BS, soft, non-tender and non-distended. No rebound or guarding. No HSM or masses noted. Rectal: deferred Msk:  Symmetrical without gross deformities. Normal posture. Extremities:  Without edema. Neurologic:  Alert and  oriented x4 Psych:  Alert and cooperative. Normal mood and affect.  Assessment  Gary Cole is a 68 y.o. male with a history of constipation, GERD, hiatal hernia, RA, and hypertension presenting today for follow-up of constipation and reflux.  Constipation, IBS: Bloating, left lower quadrant pain, and bowel infrequency has significantly improved since last visit and colonoscopy.  He did not tolerate MiraLAX well, was not effective enough.  Colonoscopy normal other than single polyp removed.  Currently doing well with Linzess  145 mcg every other day, was having significant loose stools with daily use.  Will continue  every other day regimen.  GERD: Well-controlled with pantoprazole  40 mg once daily.  Denies nausea, vomiting, or dysphagia.  PLAN   Continue Linzess  145 mcg daily.  Continue pantoprazole  40 mg daily GERD diet Stay  hydrated Follow-up 6 months  Julian Obey, MSN, FNP-BC, AGACNP-BC [facility]

## 2024-05-05 ENCOUNTER — Encounter: Payer: Self-pay | Admitting: Gastroenterology

## 2024-05-05 ENCOUNTER — Ambulatory Visit (INDEPENDENT_AMBULATORY_CARE_PROVIDER_SITE_OTHER): Admitting: Gastroenterology

## 2024-05-05 VITALS — BP 123/71 | HR 66 | Temp 97.8°F | Ht 70.0 in | Wt 167.1 lb

## 2024-05-05 DIAGNOSIS — K219 Gastro-esophageal reflux disease without esophagitis: Secondary | ICD-10-CM | POA: Diagnosis not present

## 2024-05-05 DIAGNOSIS — R1030 Lower abdominal pain, unspecified: Secondary | ICD-10-CM

## 2024-05-05 DIAGNOSIS — K581 Irritable bowel syndrome with constipation: Secondary | ICD-10-CM | POA: Diagnosis not present

## 2024-05-05 DIAGNOSIS — K5900 Constipation, unspecified: Secondary | ICD-10-CM

## 2024-05-05 NOTE — Patient Instructions (Signed)
 Continue Linzess  145 mcg every other day.  And continue your pantoprazole  40 mg once daily 30 minutes prior to breakfast.  Please let me know if you begin having any worsening abdominal pain or if Linzess  becomes ineffective for you.  We will plan to follow-up in 6 months, sooner if needed.  It was a pleasure to see you today. I want to create trusting relationships with patients. If you receive a survey regarding your visit,  I greatly appreciate you taking time to fill this out on paper or through your MyChart. I value your feedback.  Julian Obey, MSN, FNP-BC, AGACNP-BC [facility]

## 2024-05-11 DIAGNOSIS — M546 Pain in thoracic spine: Secondary | ICD-10-CM | POA: Diagnosis not present

## 2024-05-11 DIAGNOSIS — M9903 Segmental and somatic dysfunction of lumbar region: Secondary | ICD-10-CM | POA: Diagnosis not present

## 2024-05-11 DIAGNOSIS — M542 Cervicalgia: Secondary | ICD-10-CM | POA: Diagnosis not present

## 2024-05-11 DIAGNOSIS — M9901 Segmental and somatic dysfunction of cervical region: Secondary | ICD-10-CM | POA: Diagnosis not present

## 2024-05-11 DIAGNOSIS — M62830 Muscle spasm of back: Secondary | ICD-10-CM | POA: Diagnosis not present

## 2024-05-11 DIAGNOSIS — M9902 Segmental and somatic dysfunction of thoracic region: Secondary | ICD-10-CM | POA: Diagnosis not present

## 2024-06-23 DIAGNOSIS — M7989 Other specified soft tissue disorders: Secondary | ICD-10-CM | POA: Diagnosis not present

## 2024-06-23 DIAGNOSIS — M0609 Rheumatoid arthritis without rheumatoid factor, multiple sites: Secondary | ICD-10-CM | POA: Diagnosis not present

## 2024-06-23 DIAGNOSIS — R5382 Chronic fatigue, unspecified: Secondary | ICD-10-CM | POA: Diagnosis not present

## 2024-07-08 DIAGNOSIS — G5603 Carpal tunnel syndrome, bilateral upper limbs: Secondary | ICD-10-CM | POA: Diagnosis not present

## 2024-07-09 DIAGNOSIS — M9902 Segmental and somatic dysfunction of thoracic region: Secondary | ICD-10-CM | POA: Diagnosis not present

## 2024-07-09 DIAGNOSIS — M62830 Muscle spasm of back: Secondary | ICD-10-CM | POA: Diagnosis not present

## 2024-07-09 DIAGNOSIS — M546 Pain in thoracic spine: Secondary | ICD-10-CM | POA: Diagnosis not present

## 2024-07-09 DIAGNOSIS — M9903 Segmental and somatic dysfunction of lumbar region: Secondary | ICD-10-CM | POA: Diagnosis not present

## 2024-07-09 DIAGNOSIS — M542 Cervicalgia: Secondary | ICD-10-CM | POA: Diagnosis not present

## 2024-07-09 DIAGNOSIS — M9901 Segmental and somatic dysfunction of cervical region: Secondary | ICD-10-CM | POA: Diagnosis not present

## 2024-08-03 DIAGNOSIS — Z6824 Body mass index (BMI) 24.0-24.9, adult: Secondary | ICD-10-CM | POA: Diagnosis not present

## 2024-08-03 DIAGNOSIS — M069 Rheumatoid arthritis, unspecified: Secondary | ICD-10-CM | POA: Diagnosis not present

## 2024-08-25 DIAGNOSIS — R030 Elevated blood-pressure reading, without diagnosis of hypertension: Secondary | ICD-10-CM | POA: Diagnosis not present

## 2024-10-28 ENCOUNTER — Encounter: Payer: Self-pay | Admitting: Gastroenterology
